# Patient Record
Sex: Male | Born: 1980 | Race: White | Hispanic: No | Smoking: Former smoker
Health system: Southern US, Community
[De-identification: ages and names within clinical notes are randomized; demographics above are authoritative.]

## PROBLEM LIST (undated history)

## (undated) DIAGNOSIS — J45909 Unspecified asthma, uncomplicated: Secondary | ICD-10-CM

## (undated) DIAGNOSIS — W3400XA Accidental discharge from unspecified firearms or gun, initial encounter: Secondary | ICD-10-CM

## (undated) DIAGNOSIS — F419 Anxiety disorder, unspecified: Secondary | ICD-10-CM

## (undated) DIAGNOSIS — F329 Major depressive disorder, single episode, unspecified: Secondary | ICD-10-CM

## (undated) DIAGNOSIS — F32A Depression, unspecified: Secondary | ICD-10-CM

## (undated) DIAGNOSIS — G8929 Other chronic pain: Secondary | ICD-10-CM

## (undated) HISTORY — PX: PLEURAL SCARIFICATION: SHX748

## (undated) HISTORY — DX: Other chronic pain: G89.29

---
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
## 2000-02-16 ENCOUNTER — Emergency Department (HOSPITAL_COMMUNITY): Admission: EM | Admit: 2000-02-16 | Discharge: 2000-02-16 | Payer: Self-pay

## 2000-02-16 ENCOUNTER — Encounter: Payer: Self-pay | Admitting: Emergency Medicine

## 2000-06-02 ENCOUNTER — Encounter: Admission: RE | Admit: 2000-06-02 | Discharge: 2000-08-31 | Payer: Self-pay | Admitting: Orthopedic Surgery

## 2002-09-15 ENCOUNTER — Encounter: Payer: Self-pay | Admitting: Surgery

## 2002-09-15 ENCOUNTER — Inpatient Hospital Stay (HOSPITAL_COMMUNITY): Admission: AC | Admit: 2002-09-15 | Discharge: 2002-09-17 | Payer: Self-pay

## 2002-09-15 ENCOUNTER — Encounter: Payer: Self-pay | Admitting: General Surgery

## 2002-09-16 ENCOUNTER — Encounter: Payer: Self-pay | Admitting: Surgery

## 2003-12-21 ENCOUNTER — Emergency Department (HOSPITAL_COMMUNITY): Admission: EM | Admit: 2003-12-21 | Discharge: 2003-12-21 | Payer: Self-pay | Admitting: Emergency Medicine

## 2005-01-24 ENCOUNTER — Emergency Department (HOSPITAL_COMMUNITY): Admission: EM | Admit: 2005-01-24 | Discharge: 2005-01-24 | Payer: Self-pay | Admitting: Emergency Medicine

## 2005-03-23 ENCOUNTER — Emergency Department (HOSPITAL_COMMUNITY): Admission: AC | Admit: 2005-03-23 | Discharge: 2005-03-23 | Payer: Self-pay

## 2005-12-15 ENCOUNTER — Emergency Department (HOSPITAL_COMMUNITY): Admission: EM | Admit: 2005-12-15 | Discharge: 2005-12-15 | Payer: Self-pay | Admitting: Emergency Medicine

## 2006-10-07 ENCOUNTER — Encounter: Admission: RE | Admit: 2006-10-07 | Discharge: 2006-10-07 | Payer: Self-pay | Admitting: Internal Medicine

## 2007-01-12 ENCOUNTER — Emergency Department (HOSPITAL_COMMUNITY): Admission: EM | Admit: 2007-01-12 | Discharge: 2007-01-12 | Payer: Self-pay | Admitting: Emergency Medicine

## 2007-02-25 ENCOUNTER — Emergency Department (HOSPITAL_COMMUNITY): Admission: EM | Admit: 2007-02-25 | Discharge: 2007-02-25 | Payer: Self-pay | Admitting: Emergency Medicine

## 2007-05-31 ENCOUNTER — Emergency Department (HOSPITAL_COMMUNITY): Admission: EM | Admit: 2007-05-31 | Discharge: 2007-05-31 | Payer: Self-pay | Admitting: Emergency Medicine

## 2007-08-26 ENCOUNTER — Emergency Department (HOSPITAL_COMMUNITY): Admission: EM | Admit: 2007-08-26 | Discharge: 2007-08-26 | Payer: Self-pay | Admitting: Emergency Medicine

## 2007-10-12 ENCOUNTER — Encounter: Admission: RE | Admit: 2007-10-12 | Discharge: 2007-10-12 | Payer: Self-pay | Admitting: Family Medicine

## 2007-10-12 ENCOUNTER — Ambulatory Visit: Payer: Self-pay | Admitting: Family Medicine

## 2007-10-18 ENCOUNTER — Ambulatory Visit: Payer: Self-pay | Admitting: Family Medicine

## 2007-11-20 ENCOUNTER — Emergency Department (HOSPITAL_COMMUNITY): Admission: EM | Admit: 2007-11-20 | Discharge: 2007-11-20 | Payer: Self-pay | Admitting: Emergency Medicine

## 2008-02-14 ENCOUNTER — Emergency Department (HOSPITAL_COMMUNITY): Admission: EM | Admit: 2008-02-14 | Discharge: 2008-02-14 | Payer: Self-pay | Admitting: Family Medicine

## 2008-02-24 ENCOUNTER — Ambulatory Visit: Payer: Self-pay | Admitting: Family Medicine

## 2008-11-07 ENCOUNTER — Emergency Department (HOSPITAL_COMMUNITY): Admission: EM | Admit: 2008-11-07 | Discharge: 2008-11-07 | Payer: Self-pay | Admitting: Emergency Medicine

## 2008-11-08 ENCOUNTER — Ambulatory Visit (HOSPITAL_COMMUNITY): Admission: RE | Admit: 2008-11-08 | Discharge: 2008-11-08 | Payer: Self-pay | Admitting: Psychiatry

## 2008-11-21 ENCOUNTER — Emergency Department (HOSPITAL_COMMUNITY): Admission: EM | Admit: 2008-11-21 | Discharge: 2008-11-22 | Payer: Self-pay | Admitting: Emergency Medicine

## 2009-02-05 ENCOUNTER — Emergency Department (HOSPITAL_COMMUNITY): Admission: EM | Admit: 2009-02-05 | Discharge: 2009-02-05 | Payer: Self-pay | Admitting: Family Medicine

## 2009-03-13 ENCOUNTER — Emergency Department (HOSPITAL_COMMUNITY): Admission: EM | Admit: 2009-03-13 | Discharge: 2009-03-13 | Payer: Self-pay | Admitting: Emergency Medicine

## 2009-10-12 ENCOUNTER — Emergency Department (HOSPITAL_COMMUNITY): Admission: EM | Admit: 2009-10-12 | Discharge: 2009-10-12 | Payer: Self-pay | Admitting: Emergency Medicine

## 2009-10-15 ENCOUNTER — Ambulatory Visit: Payer: Self-pay | Admitting: Family Medicine

## 2009-11-01 ENCOUNTER — Encounter: Admission: RE | Admit: 2009-11-01 | Discharge: 2009-11-01 | Payer: Self-pay | Admitting: Family Medicine

## 2009-11-01 ENCOUNTER — Ambulatory Visit: Payer: Self-pay | Admitting: Family Medicine

## 2009-11-02 ENCOUNTER — Ambulatory Visit: Payer: Self-pay | Admitting: Family Medicine

## 2010-01-07 ENCOUNTER — Emergency Department (HOSPITAL_COMMUNITY)
Admission: EM | Admit: 2010-01-07 | Discharge: 2010-01-08 | Payer: Self-pay | Source: Home / Self Care | Admitting: Emergency Medicine

## 2010-01-10 ENCOUNTER — Ambulatory Visit
Admission: RE | Admit: 2010-01-10 | Discharge: 2010-01-10 | Payer: Self-pay | Source: Home / Self Care | Attending: Family Medicine | Admitting: Family Medicine

## 2010-03-18 LAB — COMPREHENSIVE METABOLIC PANEL
ALT: 15 U/L (ref 0–53)
Alkaline Phosphatase: 65 U/L (ref 39–117)
BUN: 18 mg/dL (ref 6–23)
Calcium: 9.3 mg/dL (ref 8.4–10.5)
Chloride: 102 mEq/L (ref 96–112)
Creatinine, Ser: 0.98 mg/dL (ref 0.4–1.5)
GFR calc Af Amer: >60 mL/min (ref >60)
GFR calc non Af Amer: >60 mL/min (ref >60)
Glucose, Bld: 93 mg/dL (ref 70–99)
Potassium: 3.6 mEq/L (ref 3.5–5.1)
Sodium: 137 mEq/L (ref 135–145)
Total Protein: 6.6 g/dL (ref 6.0–8.3)

## 2010-03-18 LAB — URINALYSIS, ROUTINE W REFLEX MICROSCOPIC
Bilirubin Urine: NEGATIVE
Protein, ur: 30 mg/dL — AB
Specific Gravity, Urine: 1.03 (ref 1.005–1.030)
Urobilinogen, UA: 0.2 mg/dL (ref 0.0–1.0)

## 2010-03-18 LAB — DIFFERENTIAL
Basophils Absolute: 0.1 10*3/uL (ref 0.0–0.1)
Eosinophils Absolute: 0.4 10*3/uL (ref 0.0–0.7)
Eosinophils Relative: 2 % (ref 0–5)
Lymphocytes Relative: 15 % (ref 12–46)
Lymphs Abs: 2.6 10*3/uL (ref 0.7–4.0)
Monocytes Absolute: 1.5 10*3/uL — ABNORMAL HIGH (ref 0.1–1.0)
Monocytes Relative: 9 % (ref 3–12)
Neutro Abs: 13.1 10*3/uL — ABNORMAL HIGH (ref 1.7–7.7)
Neutrophils Relative %: 74 % (ref 43–77)

## 2010-03-18 LAB — CBC
MCHC: 34.5 g/dL (ref 30.0–36.0)
RDW: 14.1 % (ref 11.5–15.5)

## 2010-03-18 LAB — POCT I-STAT, CHEM 8
Glucose, Bld: 89 mg/dL (ref 70–99)
Hemoglobin: 16.3 g/dL (ref 13.0–17.0)

## 2010-03-18 LAB — URINE MICROSCOPIC-ADD ON

## 2010-03-18 LAB — HEPATIC FUNCTION PANEL
Albumin: 4.2 g/dL (ref 3.5–5.2)
Alkaline Phosphatase: 65 U/L (ref 39–117)
Bilirubin, Direct: 0.2 mg/dL (ref 0.0–0.3)
Total Bilirubin: 0.8 mg/dL (ref 0.3–1.2)
Total Protein: 6.6 g/dL (ref 6.0–8.3)

## 2010-03-18 LAB — URINE CULTURE
Colony Count: NO GROWTH
Culture  Setup Time: 201201020045
Culture: NO GROWTH

## 2010-03-29 LAB — URINALYSIS, ROUTINE W REFLEX MICROSCOPIC
Glucose, UA: NEGATIVE mg/dL
Hgb urine dipstick: NEGATIVE
Ketones, ur: 15 mg/dL — AB
Nitrite: NEGATIVE
Protein, ur: NEGATIVE mg/dL
Specific Gravity, Urine: 1.028 (ref 1.005–1.030)
Urobilinogen, UA: 1 mg/dL (ref 0.0–1.0)

## 2010-04-10 LAB — COMPREHENSIVE METABOLIC PANEL
ALT: 16 U/L (ref 0–53)
Alkaline Phosphatase: 77 U/L (ref 39–117)
Chloride: 108 mEq/L (ref 96–112)
Glucose, Bld: 117 mg/dL — ABNORMAL HIGH (ref 70–99)
Potassium: 4 mEq/L (ref 3.5–5.1)
Sodium: 140 mEq/L (ref 135–145)
Total Protein: 7.8 g/dL (ref 6.0–8.3)

## 2010-04-10 LAB — URINALYSIS, ROUTINE W REFLEX MICROSCOPIC
Glucose, UA: NEGATIVE mg/dL
Nitrite: NEGATIVE
Protein, ur: NEGATIVE mg/dL
Specific Gravity, Urine: 1.028 (ref 1.005–1.030)
Urobilinogen, UA: 0.2 mg/dL (ref 0.0–1.0)

## 2010-04-10 LAB — DIFFERENTIAL
Basophils Relative: 0 % (ref 0–1)
Basophils Relative: 1 % (ref 0–1)
Eosinophils Absolute: 0.1 10*3/uL (ref 0.0–0.7)
Eosinophils Absolute: 0.2 10*3/uL (ref 0.0–0.7)
Monocytes Absolute: 0.7 10*3/uL (ref 0.1–1.0)
Monocytes Relative: 6 % (ref 3–12)
Neutrophils Relative %: 89 % — ABNORMAL HIGH (ref 43–77)
Neutrophils Relative %: 75 % (ref 43–77)

## 2010-04-10 LAB — CBC
HCT: 48.3 % (ref 39.0–52.0)
MCHC: 35.4 g/dL (ref 30.0–36.0)
MCHC: 35.1 g/dL (ref 30.0–36.0)
MCV: 87.3 fL (ref 78.0–100.0)
MCV: 87.4 fL (ref 78.0–100.0)
Platelets: 246 10*3/uL (ref 150–400)
RDW: 12.9 % (ref 11.5–15.5)

## 2010-04-10 LAB — URINE MICROSCOPIC-ADD ON

## 2010-04-10 LAB — BASIC METABOLIC PANEL
BUN: 10 mg/dL (ref 6–23)
CO2: 24 mEq/L (ref 19–32)
Chloride: 105 mEq/L (ref 96–112)
Creatinine, Ser: 0.72 mg/dL (ref 0.4–1.5)

## 2010-05-06 ENCOUNTER — Ambulatory Visit: Payer: Self-pay | Admitting: Medical

## 2010-07-28 ENCOUNTER — Inpatient Hospital Stay (INDEPENDENT_AMBULATORY_CARE_PROVIDER_SITE_OTHER)
Admission: RE | Admit: 2010-07-28 | Discharge: 2010-07-28 | Disposition: A | Discharge status: Home / Self Care | Payer: Self-pay | Source: Ambulatory Visit | Attending: Family Medicine | Admitting: Family Medicine

## 2010-07-28 DIAGNOSIS — S335XXA Sprain of ligaments of lumbar spine, initial encounter: Secondary | ICD-10-CM

## 2010-07-31 ENCOUNTER — Encounter: Payer: Self-pay | Admitting: Medical

## 2010-07-31 ENCOUNTER — Ambulatory Visit
Admission: RE | Admit: 2010-07-31 | Discharge: 2010-07-31 | Disposition: A | Discharge status: Home / Self Care | Payer: No Typology Code available for payment source | Source: Ambulatory Visit | Attending: Medical | Admitting: Medical

## 2010-07-31 ENCOUNTER — Ambulatory Visit (INDEPENDENT_AMBULATORY_CARE_PROVIDER_SITE_OTHER): Payer: No Typology Code available for payment source | Admitting: Medical

## 2010-07-31 VITALS — BP 120/70 | HR 86 | Wt 198.0 lb

## 2010-07-31 DIAGNOSIS — S335XXA Sprain of ligaments of lumbar spine, initial encounter: Secondary | ICD-10-CM

## 2010-07-31 DIAGNOSIS — R209 Unspecified disturbances of skin sensation: Secondary | ICD-10-CM

## 2010-07-31 DIAGNOSIS — S3992XA Unspecified injury of lower back, initial encounter: Secondary | ICD-10-CM

## 2010-07-31 DIAGNOSIS — M549 Dorsalgia, unspecified: Secondary | ICD-10-CM

## 2010-07-31 DIAGNOSIS — R202 Paresthesia of skin: Secondary | ICD-10-CM

## 2010-07-31 MED ORDER — HYDROCODONE-ACETAMINOPHEN 7.5-750 MG PO TABS: 1.0000 | ORAL_TABLET | Freq: Four times a day (QID) | ORAL | Status: DC | PRN

## 2010-07-31 MED ORDER — OXYCODONE HCL 5 MG PO TABS: 5.0000 mg | ORAL_TABLET | Freq: Three times a day (TID) | ORAL | Status: DC | PRN

## 2010-07-31 MED ORDER — CYCLOBENZAPRINE HCL 10 MG PO TABS: 10.0000 mg | ORAL_TABLET | Freq: Three times a day (TID) | ORAL | Status: DC | PRN

## 2010-07-31 NOTE — Progress Notes (Signed)
Subjective:    Thomas Hays is a 30 y.o. male who presents for evaluation of low back pain.  His mother is with him today.  He denies history of back problems.  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 He felt immediate pain in his low back, fell to the ground, and had to be helped back home. Since then he notes severe low back pain, and over the last few days the pain radiates to his right buttock and leg causing the right leg and toes the film him at times. He has tried over-the-counter remedies and ice with no improvement. Thus he decided to come in to get this checked out.  The following portions of the patient's history were reviewed and updated as appropriate: allergies, current medications, past family history, past medical history, past social history, past surgical history and problem list.  Review of Systems Constitutional: denies fever, chills, sweats, unexpected weight change, anorexia, fatigue Cardiology: denies chest pain, palpitations, edema Respiratory: denies cough, shortness of breath Gastroenterology: denies abdominal pain, nausea, vomiting, diarrhea, constipation, blood in stool, changes in bowel movement, dysphagia Hematology: denies bleeding or bruising problems Musculoskeletal: denies arthralgias, myalgias, joint swelling, neck pain, cramping Urology: denies dysuria, difficulty urinating, hematuria, urinary frequency, urgency, incontinence     Objective:   Filed Vitals:   07/31/10 1035  BP: 120/70  Pulse: 86    General appearance:  WD/WN, white male, crying in pain, sitting very stiff and guarded Skin: no erythema or ecchymosis, no obvious skin trauma Neck: supple, no lymphadenopathy, no thyromegaly, no masses, normal ROM Heart: RRR, normal S1, S2, no murmurs Lungs: CTA bilaterally, no wheezes, rhonchi, or rales Abdomen: +bs, soft, non  tender, non distended Back: quite tender throughout lumbar spine midline and on the right, pain with either SLR, right and left, ROM quite limited due to the pain Musculoskeletal: tender with right hip external ROM, but otherwise lower extremities non tender, no obvious deformity, normal ROM throughout Extremities: no edema Pulses: 2+ symmetric, lower extremities Neurological: LE normal strength, DTRs 3+ of patellas    Assessment:     Encounter Diagnoses  Name Primary?  . Back pain Yes  . Paresthesia of right leg   . Lower back injury      Plan:    Given his Tylenol intolerance and severe pain, gave script for Flexeril and Oxycodone today, and he will go for xray today.

## 2010-08-01 ENCOUNTER — Telehealth: Payer: Self-pay | Admitting: Medical

## 2010-08-01 MED ORDER — PREDNISONE 20 MG PO TABS: 20.0000 mg | ORAL_TABLET | Freq: Two times a day (BID) | ORAL | Status: AC

## 2010-08-01 NOTE — Telephone Encounter (Signed)
Call out (an order electronically) prednisone 20mg , 2 tablets daily x 5 days, #10 no refill.

## 2010-08-01 NOTE — Telephone Encounter (Signed)
pls call wife with results.  See other msg from today.

## 2010-08-01 NOTE — Telephone Encounter (Signed)
Sent prednisone 20 Mg 1 po BID #10 x 5 days with no refills to Pharmacy- [facility].  Pt aware.  CM, LPN

## 2010-08-01 NOTE — Telephone Encounter (Signed)
Called pt's wife and informed her of xray results.  Pt's wife wanted to know if you could give him something stronger for pain because pain medication is not working.  CM, LPN

## 2010-08-01 NOTE — Telephone Encounter (Signed)
See msg

## 2010-08-02 ENCOUNTER — Emergency Department (HOSPITAL_COMMUNITY)
Admission: EM | Admit: 2010-08-02 | Discharge: 2010-08-02 | Disposition: A | Discharge status: Home / Self Care | Payer: Self-pay | Attending: Emergency Medicine | Admitting: Emergency Medicine

## 2010-08-02 DIAGNOSIS — M545 Low back pain, unspecified: Secondary | ICD-10-CM | POA: Insufficient documentation

## 2010-08-02 DIAGNOSIS — IMO0002 Reserved for concepts with insufficient information to code with codable children: Secondary | ICD-10-CM | POA: Insufficient documentation

## 2010-08-08 ENCOUNTER — Inpatient Hospital Stay
Admit: 2010-08-08 | Discharge: 2010-08-08 | Disposition: A | Discharge status: Home / Self Care | Payer: Self-pay | Attending: Neurological Surgery | Admitting: Neurological Surgery

## 2010-08-08 ENCOUNTER — Ambulatory Visit
Admission: RE | Admit: 2010-08-08 | Discharge: 2010-08-08 | Disposition: A | Discharge status: Home / Self Care | Payer: Medicaid Other | Source: Ambulatory Visit

## 2010-08-08 DIAGNOSIS — M549 Dorsalgia, unspecified: Secondary | ICD-10-CM

## 2010-08-20 ENCOUNTER — Inpatient Hospital Stay (INDEPENDENT_AMBULATORY_CARE_PROVIDER_SITE_OTHER): Admit: 2010-08-20 | Discharge: 2010-08-20 | Disposition: A | Discharge status: Home / Self Care | Payer: Medicaid Other

## 2010-08-20 ENCOUNTER — Emergency Department (HOSPITAL_COMMUNITY)
Admission: EM | Admit: 2010-08-20 | Discharge: 2010-08-20 | Discharge status: Against Medical Advice | Payer: Medicaid Other | Attending: Emergency Medicine | Admitting: Emergency Medicine

## 2010-08-20 ENCOUNTER — Other Ambulatory Visit (HOSPITAL_COMMUNITY): Payer: Self-pay | Admitting: Neurological Surgery

## 2010-08-20 DIAGNOSIS — S335XXA Sprain of ligaments of lumbar spine, initial encounter: Secondary | ICD-10-CM

## 2010-08-20 DIAGNOSIS — M549 Dorsalgia, unspecified: Secondary | ICD-10-CM

## 2010-09-23 ENCOUNTER — Encounter: Payer: Self-pay | Admitting: Family Medicine

## 2010-09-25 LAB — CULTURE, BORDETELLA W/DFA-ST LAB: Culture: NOT DETECTED

## 2010-09-27 LAB — DIFFERENTIAL
Basophils Absolute: 0.1
Basophils Relative: 1
Eosinophils Absolute: 0.1
Eosinophils Relative: 1
Lymphocytes Relative: 11 — ABNORMAL LOW
Lymphs Abs: 1.1
Monocytes Absolute: 0.4
Monocytes Relative: 4
Neutro Abs: 8.1 — ABNORMAL HIGH
Neutrophils Relative %: 83 — ABNORMAL HIGH

## 2010-09-27 LAB — COMPREHENSIVE METABOLIC PANEL WITH GFR
Alkaline Phosphatase: 61
BUN: 13
Chloride: 108
Creatinine, Ser: 0.84
Glucose, Bld: 104 — ABNORMAL HIGH
Potassium: 3.9
Total Bilirubin: 0.8

## 2010-09-27 LAB — LIPASE, BLOOD: Lipase: 16

## 2010-09-27 LAB — CARBOXYHEMOGLOBIN
Carboxyhemoglobin: 20.2
Methemoglobin: 1.5
O2 Saturation: 56.2
Total hemoglobin: 14.4

## 2010-09-27 LAB — COMPREHENSIVE METABOLIC PANEL
ALT: 23
AST: 20
Albumin: 4.1
CO2: 28
Calcium: 9.1
GFR calc Af Amer: >60
GFR calc non Af Amer: >60
Sodium: 141
Total Protein: 6.2

## 2010-09-27 LAB — CBC
HCT: 40.2
Hemoglobin: 14.5
MCHC: 36.1 — ABNORMAL HIGH
MCV: 85.2
Platelets: 214
RBC: 4.72
RDW: 13.1
WBC: 9.8

## 2010-09-27 LAB — URINE MICROSCOPIC-ADD ON

## 2010-09-27 LAB — URINALYSIS, ROUTINE W REFLEX MICROSCOPIC
Bilirubin Urine: NEGATIVE
Glucose, UA: NEGATIVE
Hgb urine dipstick: NEGATIVE
Ketones, ur: NEGATIVE
Nitrite: NEGATIVE
Protein, ur: NEGATIVE
Specific Gravity, Urine: 1.025
Urobilinogen, UA: 0.2
pH: 7.5

## 2010-11-14 IMAGING — CR DG LUMBAR SPINE COMPLETE 4+V
5 series · 5 of 5 positions shown · non-contrast
Comparison: 11/22/2008 CT.

CLINICAL DATA: Back pain post injury for 2 weeks.

LUMBAR SPINE - COMPLETE 4+ VIEW

[view not recorded (1 of 5)]
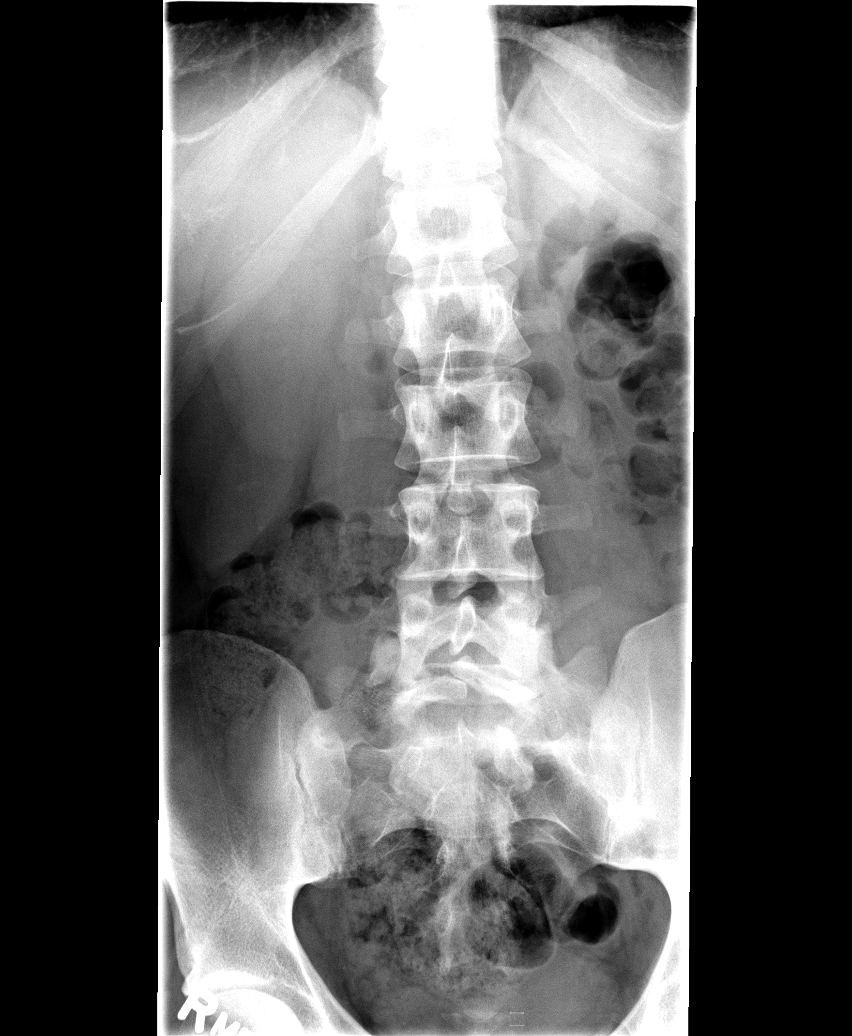

[view not recorded (2 of 5)]
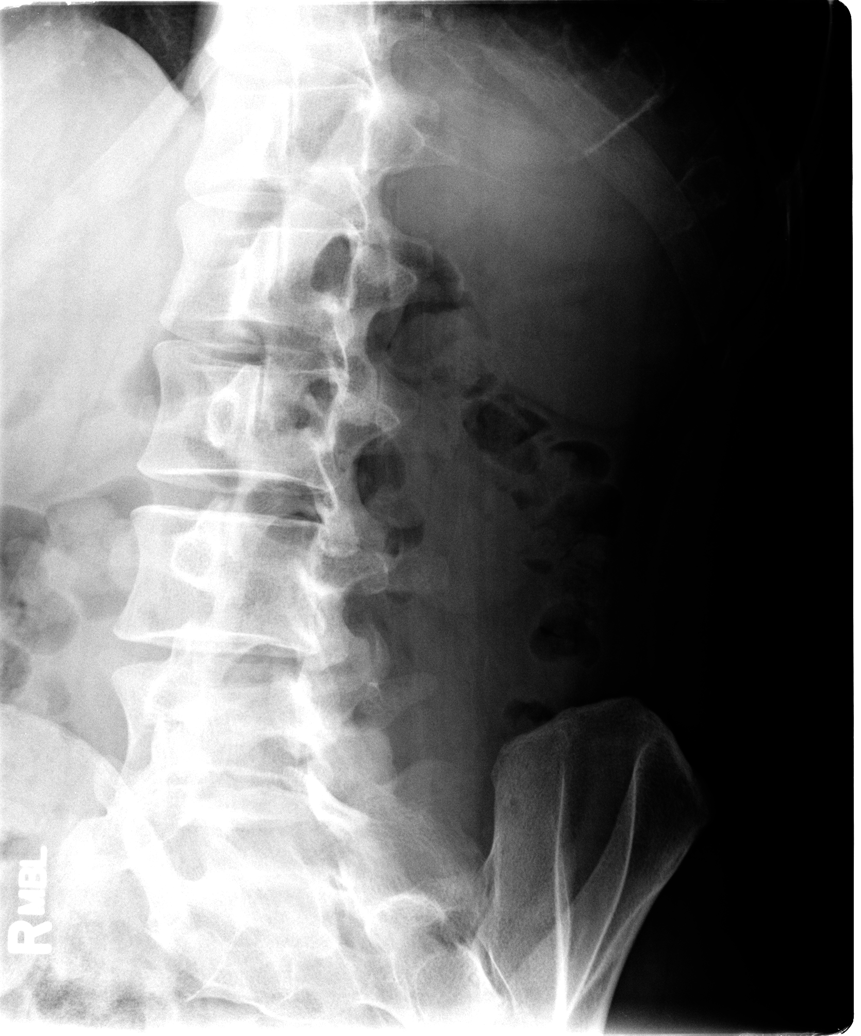

[view not recorded (3 of 5)]
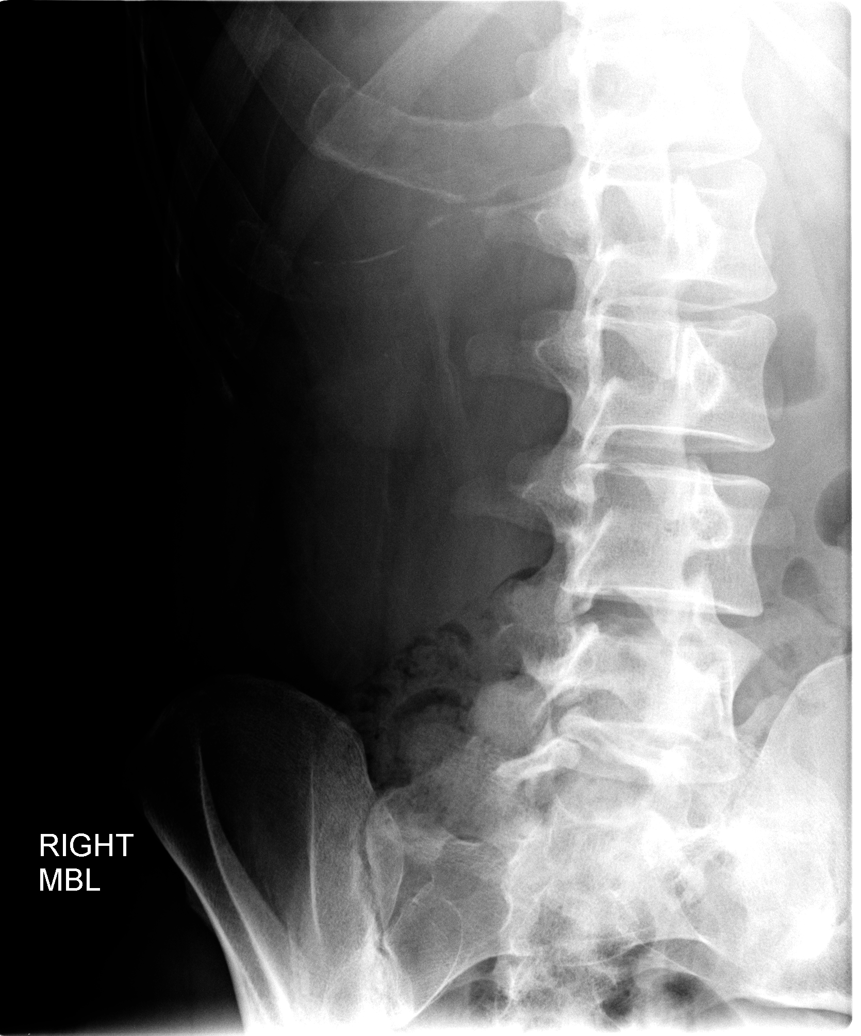

[view not recorded (4 of 5)]
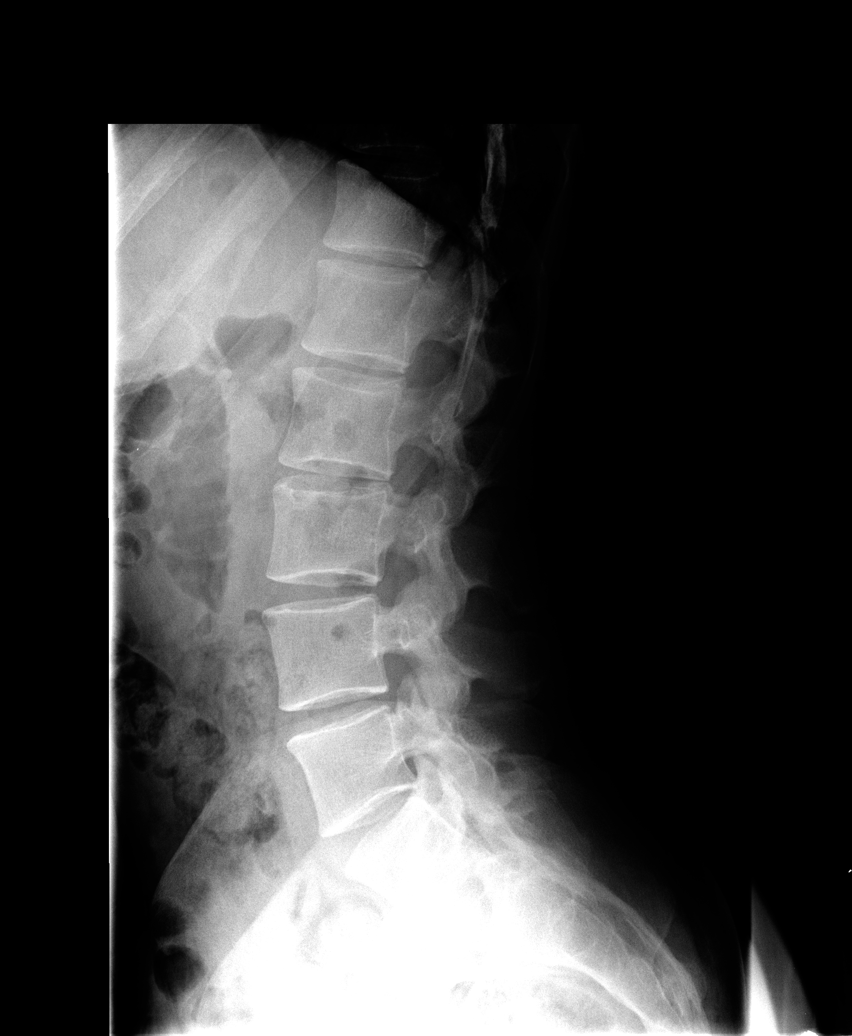

[view not recorded (5 of 5)]
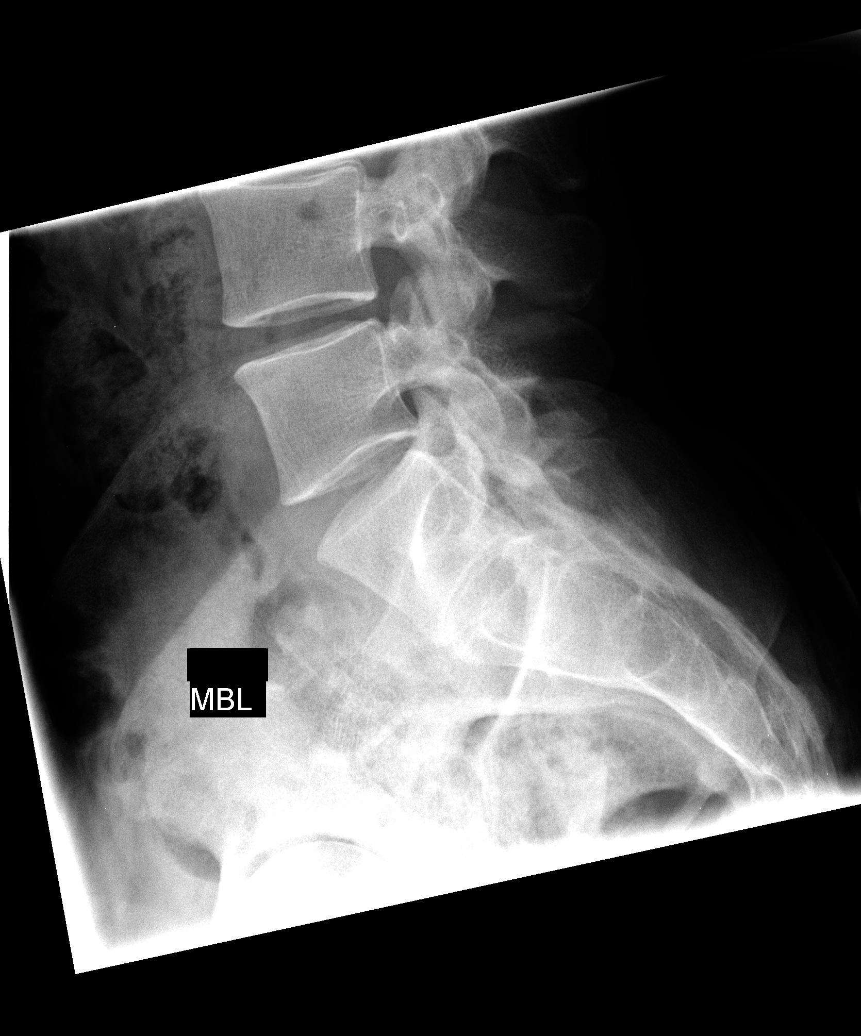

[5 of 5 positions shown; findings below may reference images not displayed]

FINDINGS: Transitional vertebra lumbosacral junction.  No fracture
or malalignment.  Mild disc space narrowing L4-5.
IMPRESSION: Mild disc space narrowing L4-5.

Transitional appearance S1 vertebra.

## 2010-11-24 ENCOUNTER — Inpatient Hospital Stay (HOSPITAL_COMMUNITY)
Admission: EM | Admit: 2010-11-24 | Discharge: 2010-11-27 | DRG: 392 | Disposition: A | Discharge status: Home / Self Care | Payer: Medicaid Other | Attending: Internal Medicine | Admitting: Internal Medicine

## 2010-11-24 ENCOUNTER — Emergency Department (HOSPITAL_COMMUNITY): Payer: Medicaid Other

## 2010-11-24 ENCOUNTER — Inpatient Hospital Stay (HOSPITAL_COMMUNITY): Payer: Medicaid Other

## 2010-11-24 ENCOUNTER — Encounter (HOSPITAL_COMMUNITY): Payer: Self-pay | Admitting: *Deleted

## 2010-11-24 DIAGNOSIS — J939 Pneumothorax, unspecified: Secondary | ICD-10-CM | POA: Insufficient documentation

## 2010-11-24 DIAGNOSIS — J209 Acute bronchitis, unspecified: Secondary | ICD-10-CM | POA: Diagnosis present

## 2010-11-24 DIAGNOSIS — N179 Acute kidney failure, unspecified: Secondary | ICD-10-CM | POA: Diagnosis present

## 2010-11-24 DIAGNOSIS — E860 Dehydration: Secondary | ICD-10-CM | POA: Diagnosis present

## 2010-11-24 DIAGNOSIS — R112 Nausea with vomiting, unspecified: Secondary | ICD-10-CM

## 2010-11-24 DIAGNOSIS — A088 Other specified intestinal infections: Principal | ICD-10-CM | POA: Diagnosis present

## 2010-11-24 DIAGNOSIS — K529 Noninfective gastroenteritis and colitis, unspecified: Secondary | ICD-10-CM | POA: Diagnosis present

## 2010-11-24 DIAGNOSIS — J40 Bronchitis, not specified as acute or chronic: Secondary | ICD-10-CM

## 2010-11-24 DIAGNOSIS — Z79899 Other long term (current) drug therapy: Secondary | ICD-10-CM

## 2010-11-24 DIAGNOSIS — D72829 Elevated white blood cell count, unspecified: Secondary | ICD-10-CM | POA: Diagnosis present

## 2010-11-24 HISTORY — DX: Accidental discharge from unspecified firearms or gun, initial encounter: W34.00XA

## 2010-11-24 LAB — URINALYSIS, ROUTINE W REFLEX MICROSCOPIC
Leukocytes, UA: NEGATIVE
Nitrite: NEGATIVE
Specific Gravity, Urine: 1.019 (ref 1.005–1.030)
Urobilinogen, UA: 0.2 mg/dL (ref 0.0–1.0)
pH: 5 (ref 5.0–8.0)

## 2010-11-24 LAB — DIFFERENTIAL
Basophils Absolute: 0 10*3/uL (ref 0.0–0.1)
Basophils Relative: 0 % (ref 0–1)
Eosinophils Absolute: 0 10*3/uL (ref 0.0–0.7)
Monocytes Absolute: 2.8 10*3/uL — ABNORMAL HIGH (ref 0.1–1.0)
Neutro Abs: 16.6 10*3/uL — ABNORMAL HIGH (ref 1.7–7.7)
Neutrophils Relative %: 81 % — ABNORMAL HIGH (ref 43–77)

## 2010-11-24 LAB — BASIC METABOLIC PANEL
BUN: 55 mg/dL — ABNORMAL HIGH (ref 6–23)
CO2: 23 mEq/L (ref 19–32)
CO2: 22 mEq/L (ref 19–32)
Calcium: 10.6 mg/dL — ABNORMAL HIGH (ref 8.4–10.5)
Calcium: 8.6 mg/dL (ref 8.4–10.5)
Chloride: 89 mEq/L — ABNORMAL LOW (ref 96–112)
Creatinine, Ser: 2.47 mg/dL — ABNORMAL HIGH (ref 0.50–1.35)
Creatinine, Ser: 1.31 mg/dL (ref 0.50–1.35)
Glucose, Bld: 130 mg/dL — ABNORMAL HIGH (ref 70–99)
Glucose, Bld: 113 mg/dL — ABNORMAL HIGH (ref 70–99)
Sodium: 138 mEq/L (ref 135–145)

## 2010-11-24 LAB — CBC
HCT: 53.1 % — ABNORMAL HIGH (ref 39.0–52.0)
HCT: 40.8 % (ref 39.0–52.0)
Hemoglobin: 14.7 g/dL (ref 13.0–17.0)
MCH: 30.3 pg (ref 26.0–34.0)
MCH: 29.6 pg (ref 26.0–34.0)
MCHC: 36 g/dL (ref 30.0–36.0)
MCV: 81.3 fL (ref 78.0–100.0)
MCV: 82.3 fL (ref 78.0–100.0)
Platelets: 377 10*3/uL (ref 150–400)
RBC: 4.96 MIL/uL (ref 4.22–5.81)
RDW: 13.2 % (ref 11.5–15.5)

## 2010-11-24 LAB — URINE MICROSCOPIC-ADD ON

## 2010-11-24 MED ORDER — AZITHROMYCIN 250 MG PO TABS
500.0000 mg | ORAL_TABLET | Freq: Every day | ORAL | Status: AC
  Administered 2010-11-24: 500 mg via ORAL
  Filled 2010-11-24: qty 2

## 2010-11-24 MED ORDER — NICOTINE 14 MG/24HR TD PT24
14.0000 mg | MEDICATED_PATCH | Freq: Every day | TRANSDERMAL | Status: DC
  Administered 2010-11-24 – 2010-11-27 (×4): 14 mg via TRANSDERMAL
  Filled 2010-11-24 (×5): qty 1

## 2010-11-24 MED ORDER — SODIUM CHLORIDE 0.9 % IV SOLN
INTRAVENOUS | Status: DC
  Administered 2010-11-24 – 2010-11-26 (×4): via INTRAVENOUS
  Administered 2010-11-26: 125 mL/h via INTRAVENOUS

## 2010-11-24 MED ORDER — HYDROMORPHONE HCL PF 1 MG/ML IJ SOLN
1.0000 mg | Freq: Once | INTRAMUSCULAR | Status: AC
  Administered 2010-11-24: 1 mg via INTRAVENOUS

## 2010-11-24 MED ORDER — SODIUM CHLORIDE 0.9 % IV BOLUS (SEPSIS)
1000.0000 mL | Freq: Once | INTRAVENOUS | Status: AC
  Administered 2010-11-24: 1000 mL via INTRAVENOUS

## 2010-11-24 MED ORDER — AZITHROMYCIN 250 MG PO TABS
250.0000 mg | ORAL_TABLET | Freq: Every day | ORAL | Status: DC
Start: 2010-11-25 — End: 2010-11-27
  Administered 2010-11-25 – 2010-11-27 (×3): 250 mg via ORAL
  Filled 2010-11-24 (×3): qty 1

## 2010-11-24 MED ORDER — HYDROMORPHONE HCL PF 2 MG/ML IJ SOLN
INTRAMUSCULAR | Status: AC
  Administered 2010-11-24: 16:00:00
  Filled 2010-11-24: qty 1

## 2010-11-24 MED ORDER — ALUM & MAG HYDROXIDE-SIMETH 200-200-20 MG/5ML PO SUSP: 30.0000 mL | Freq: Four times a day (QID) | ORAL | Status: DC | PRN

## 2010-11-24 MED ORDER — ONDANSETRON HCL 4 MG/2ML IJ SOLN
4.0000 mg | Freq: Four times a day (QID) | INTRAMUSCULAR | Status: DC | PRN
  Administered 2010-11-25: 4 mg via INTRAVENOUS
  Filled 2010-11-24: qty 2

## 2010-11-24 MED ORDER — PANTOPRAZOLE SODIUM 40 MG PO TBEC
40.0000 mg | DELAYED_RELEASE_TABLET | Freq: Every day | ORAL | Status: DC
  Administered 2010-11-26 – 2010-11-27 (×2): 40 mg via ORAL
  Filled 2010-11-24 (×3): qty 1

## 2010-11-24 MED ORDER — HYDROMORPHONE HCL PF 1 MG/ML IJ SOLN
1.0000 mg | INTRAMUSCULAR | Status: DC | PRN
  Administered 2010-11-24 – 2010-11-27 (×14): 1 mg via INTRAVENOUS
  Filled 2010-11-24 (×14): qty 1

## 2010-11-24 MED ORDER — ONDANSETRON HCL 4 MG PO TABS: 4.0000 mg | ORAL_TABLET | Freq: Four times a day (QID) | ORAL | Status: DC | PRN

## 2010-11-24 MED ORDER — HYDROMORPHONE HCL PF 1 MG/ML IJ SOLN
1.0000 mg | Freq: Once | INTRAMUSCULAR | Status: AC
  Administered 2010-11-24: 1 mg via INTRAVENOUS
  Filled 2010-11-24: qty 1

## 2010-11-24 MED ORDER — HYDROMORPHONE HCL PF 1 MG/ML IJ SOLN: 0.5000 mg | INTRAMUSCULAR | Status: DC | PRN

## 2010-11-24 MED ORDER — HEPARIN SODIUM (PORCINE) 5000 UNIT/ML IJ SOLN
5000.0000 [IU] | Freq: Three times a day (TID) | INTRAMUSCULAR | Status: DC
  Administered 2010-11-25 (×3): 5000 [IU] via SUBCUTANEOUS
  Filled 2010-11-24 (×13): qty 1

## 2010-11-24 MED ORDER — ONDANSETRON HCL 4 MG/2ML IJ SOLN
4.0000 mg | Freq: Once | INTRAMUSCULAR | Status: AC
  Administered 2010-11-24: 4 mg via INTRAVENOUS
  Filled 2010-11-24: qty 2
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
## 2010-11-24 NOTE — ED Notes (Signed)
Pt here complaining of nausea and he believes he has gotten food poisoning from eating nachos.  Pt is having abdominal cramping that he states is a 9/10 at this time.  Pt is very thin and his eyes appear more withdrawn and sunken back in his head.  Pt family states he has lost 15 pounds since Friday. Pt mucous membranes are dry and he appears to be dehydrated.

## 2010-11-24 NOTE — ED Notes (Signed)
Pt has not been able to hold anything down since Friday, face is sunken in and this is new per family
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
## 2010-11-25 LAB — PHOSPHORUS: Phosphorus: 2.3 mg/dL (ref 2.3–4.6)

## 2010-11-25 LAB — CREATININE, URINE, RANDOM: Creatinine, Urine: 131.42 mg/dL

## 2010-11-25 LAB — DIFFERENTIAL
Basophils Relative: 0 % (ref 0–1)
Eosinophils Absolute: 0 10*3/uL (ref 0.0–0.7)
Lymphs Abs: 1.6 10*3/uL (ref 0.7–4.0)
Neutrophils Relative %: 71 % (ref 43–77)

## 2010-11-25 LAB — CBC
MCH: 29.2 pg (ref 26.0–34.0)
MCHC: 34.7 g/dL (ref 30.0–36.0)
Platelets: 227 10*3/uL (ref 150–400)
RBC: 4.45 MIL/uL (ref 4.22–5.81)

## 2010-11-25 LAB — COMPREHENSIVE METABOLIC PANEL
Alkaline Phosphatase: 53 U/L (ref 39–117)
BUN: 30 mg/dL — ABNORMAL HIGH (ref 6–23)
Creatinine, Ser: 0.68 mg/dL (ref 0.50–1.35)
GFR calc Af Amer: >90 mL/min (ref >90)
Glucose, Bld: 104 mg/dL — ABNORMAL HIGH (ref 70–99)
Potassium: 4.1 mEq/L (ref 3.5–5.1)
Total Protein: 5.9 g/dL — ABNORMAL LOW (ref 6.0–8.3)

## 2010-11-25 LAB — PROTIME-INR
INR: 1.18 (ref 0.00–1.49)
Prothrombin Time: 15.3 seconds — ABNORMAL HIGH (ref 11.6–15.2)

## 2010-11-25 LAB — SODIUM, URINE, RANDOM: Sodium, Ur: 64 mEq/L

## 2010-11-25 LAB — MAGNESIUM: Magnesium: 2.3 mg/dL (ref 1.5–2.5)

## 2010-11-25 NOTE — Progress Notes (Signed)
Subjective: Feeling much better Objective: Vital signs in last 24 hours: Filed Vitals:   11/24/10 2058 11/25/10 0516 11/25/10 0818 11/25/10 1300  BP: 124/79 108/62  116/62  Pulse: 95 73  112  Temp: 100.2 F (37.9 C) 98.9 F (37.2 C)  98.4 F (36.9 C)  TempSrc: Oral Oral  Axillary  Resp: 18 16  18   Height:      Weight:   59.33 kg (130 lb 12.8 oz)   SpO2: 96% 98%  95%   Weight change:   Intake/Output Summary (Last 24 hours) at 11/25/10 1957 Last data filed at 11/25/10 1700  Gross per 24 hour  Intake   1915 ml  Output   1215 ml  Net    700 ml   Physical Exam:   General Appearance:    Alert, cooperative, no distress, appears stated age  Lungs:     Clear to auscultation bilaterally, respirations unlabored   Heart:    Regular rate and rhythm, S1 and S2 normal, no murmur, rub   or gallop  Abdomen:     Soft, non-tender, bowel sounds active all four quadrants,    no masses, no organomegaly  Extremities:   Extremities normal, atraumatic, no cyanosis or edema  Pulses:   2+ and symmetric all extremities  Skin:   Skin color, texture, turgor normal, no rashes or lesions  Neurologic:   CNII-XII intact, normal strength, sensation and reflexes    throughout     Lab Results: Results for orders placed during the hospital encounter of 11/24/10 (from the past 24 hour(s))  SODIUM, URINE, RANDOM     Status: Normal   Collection Time   11/25/10  2:51 AM      Component Value Range   Sodium, Ur 64    CREATININE, URINE, RANDOM     Status: Normal   Collection Time   11/25/10  2:51 AM      Component Value Range   Creatinine, Urine 131.42    COMPREHENSIVE METABOLIC PANEL     Status: Abnormal   Collection Time   11/25/10  7:00 AM      Component Value Range   Sodium 140  135 - 145 (mEq/L)   Potassium 4.1  3.5 - 5.1 (mEq/L)   Chloride 107  96 - 112 (mEq/L)   CO2 24  19 - 32 (mEq/L)   Glucose, Bld 104 (*) 70 - 99 (mg/dL)   BUN 30 (*) 6 - 23 (mg/dL)   Creatinine, Ser 7.82  0.50 - 1.35  (mg/dL)   Calcium 8.4  8.4 - 95.6 (mg/dL)   Total Protein 5.9 (*) 6.0 - 8.3 (g/dL)   Albumin 3.5  3.5 - 5.2 (g/dL)   AST 19  0 - 37 (U/L)   ALT 9  0 - 53 (U/L)   Alkaline Phosphatase 53  39 - 117 (U/L)   Total Bilirubin 1.0  0.3 - 1.2 (mg/dL)   GFR calc non Af Amer >90  >90 (mL/min)   GFR calc Af Amer >90  >90 (mL/min)  MAGNESIUM     Status: Normal   Collection Time   11/25/10  7:00 AM      Component Value Range   Magnesium 2.3  1.5 - 2.5 (mg/dL)  PHOSPHORUS     Status: Normal   Collection Time   11/25/10  7:00 AM      Component Value Range   Phosphorus 2.3  2.3 - 4.6 (mg/dL)  CBC     Status: Abnormal  Collection Time   11/25/10  7:00 AM      Component Value Range   WBC 9.8  4.0 - 10.5 (K/uL)   RBC 4.45  4.22 - 5.81 (MIL/uL)   Hemoglobin 13.0  13.0 - 17.0 (g/dL)   HCT 16.1 (*) 09.6 - 52.0 (%)   MCV 84.3  78.0 - 100.0 (fL)   MCH 29.2  26.0 - 34.0 (pg)   MCHC 34.7  30.0 - 36.0 (g/dL)   RDW 04.5  40.9 - 81.1 (%)   Platelets 227  150 - 400 (K/uL)  DIFFERENTIAL     Status: Abnormal   Collection Time   11/25/10  7:00 AM      Component Value Range   Neutrophils Relative 71  43 - 77 (%)   Neutro Abs 6.9  1.7 - 7.7 (K/uL)   Lymphocytes Relative 16  12 - 46 (%)   Lymphs Abs 1.6  0.7 - 4.0 (K/uL)   Monocytes Relative 13 (*) 3 - 12 (%)   Monocytes Absolute 1.3 (*) 0.1 - 1.0 (K/uL)   Eosinophils Relative 0  0 - 5 (%)   Eosinophils Absolute 0.0  0.0 - 0.7 (K/uL)   Basophils Relative 0  0 - 1 (%)   Basophils Absolute 0.0  0.0 - 0.1 (K/uL)  PROTIME-INR     Status: Abnormal   Collection Time   11/25/10  7:00 AM      Component Value Range   Prothrombin Time 15.3 (*) 11.6 - 15.2 (seconds)   INR 1.18  0.00 - 1.49   TSH     Status: Normal   Collection Time   11/25/10  7:00 AM      Component Value Range   TSH 0.775  0.350 - 4.500 (uIU/mL)  CLOSTRIDIUM DIFFICILE BY PCR     Status: Normal   Collection Time   11/25/10  8:21 AM      Component Value Range   C difficile by pcr  NEGATIVE  NEGATIVE    Micro Results: Recent Results (from the past 240 hour(s))  CLOSTRIDIUM DIFFICILE BY PCR     Status: Normal   Collection Time   11/25/10  8:21 AM      Component Value Range Status Comment   C difficile by pcr NEGATIVE  NEGATIVE  Final    Studies/Results: Ct Abdomen Pelvis Wo Contrast  11/24/2010  *RADIOLOGY REPORT*  Clinical Data: Nausea, vomiting, diarrhea.  CT ABDOMEN AND PELVIS WITHOUT CONTRAST  Technique:  Multidetector CT imaging of the abdomen and pelvis was performed following the standard protocol without intravenous contrast.  Comparison: 01/08/2010  Findings:  Limited images through the lung bases demonstrate no significant appreciable abnormality. The heart size is within normal limits. No pleural or pericardial effusion.  Intra-abdominal organ evaluation is limited without intravenous contrast.  Within this limitation, unremarkable liver, spleen, pancreas, adrenal glands.  The gallbladder is not well delineated. No biliary ductal dilatation.  Symmetric renal size. 5 mm hyperdense focus within the interpolar/lower pole left kidney is nonspecific.  May reflect a tiny hyperdense cyst.  No hydronephrosis or hydroureter.  No urinary tract calculi identified. 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  Two ureters, likely fused proximal to the UVJ.  No bowel obstruction.  Normal appendix.  No free intraperitoneal air or fluid.  Normal caliber vasculature. No lymphadenopathy.  Thin-walled bladder.  Several sclerotic foci, including within the right femoral neck, right femoral head, left iliac bone, likely reflect bone as.  There is partial ankylosis of  the bilateral SI joints, left greater than right.  IMPRESSION:  Within limitations of a noncontrast exam, no acute abnormality identified.  No hydronephrosis or hydroureter.  No urinary tract calculi.  Original Report Authenticated By: Waneta Martins, M.D.   Ct Head Wo Contrast  11/24/2010  *RADIOLOGY REPORT*  Clinical  Data: Fall.  Headache.  Nausea and vomiting.  CT HEAD WITHOUT CONTRAST 11/24/2010:  Technique:  Contiguous axial images were obtained from the base of the skull through the vertex without contrast.  Comparison: Unenhanced cranial CT 03/23/2005 and 01/24/2005 [facility].  Findings: Ventricular system normal in size and appearance for age. No mass lesion.  No midline shift.  No acute hemorrhage or hematoma.  No extra-axial fluid collections.  No evidence of acute infarction.  No focal brain parenchymal abnormalities.  No significant interval change.  No skull fractures or other focal osseous abnormalities involving the skull.  Visualized paranasal sinuses, mastoid air cells, and middle ear cavities well-aerated.  IMPRESSION: Normal and stable examination.  Original Report Authenticated By: Arnell Sieving, M.D.   Dg Abd Acute W/chest  11/24/2010  *RADIOLOGY REPORT*  Clinical Data: Abdominal pain.  Altered level of consciousness. Weakness.  ACUTE ABDOMEN SERIES (ABDOMEN 2 VIEW & CHEST 1 VIEW)  Comparison: 07/31/2010  Findings: Wedge resection clips noted at the left lung apex.  Attenuated peripheral pulmonary vasculature is compatible with emphysema/COPD.  Cardiac and mediastinal contours appear unremarkable.  No pleural effusion noted.  The bowel is nearly gasless. I suspect a dilated loop of small bowel in the lower pelvis, measuring 3.8 cm in diameter.  IMPRESSION:  1.  Suspected dilated loop of small bowel in the pelvis.  The appearance is considered abnormal but nonspecific and could be from ileus or obstruction.  Most of the bowel is gasless. 2.  Emphysema. 3.  The wedge resection clips noted at the left lung apex.  Original Report Authenticated By: Dellia Cloud, M.D.   Medications: reviewed Scheduled Meds:   . azithromycin  250 mg Oral Daily  . heparin  5,000 Units Subcutaneous Q8H  . nicotine  14 mg Transdermal Daily  . pantoprazole  40 mg Oral Q0600   Continuous Infusions:    . sodium chloride 125 mL/hr at 11/25/10 1632   PRN Meds:.alum & mag hydroxide-simeth, HYDROmorphone, ondansetron (ZOFRAN) IV, ondansetron Assessment/Plan: Principal Problem:  *Gastroenteritis, acute resolved Active Problems:  Bronchitis improving  Acute renal failure resolved  Dehydration resolved.   LOS: 1 day   Legend Tumminello 11/25/2010, 7:57 PM

## 2010-11-26 LAB — BASIC METABOLIC PANEL
BUN: 19 mg/dL (ref 6–23)
CO2: 24 mEq/L (ref 19–32)
Chloride: 107 mEq/L (ref 96–112)
Creatinine, Ser: 0.6 mg/dL (ref 0.50–1.35)

## 2010-11-26 LAB — CBC
HCT: 34.3 % — ABNORMAL LOW (ref 39.0–52.0)
Hemoglobin: 11.8 g/dL — ABNORMAL LOW (ref 13.0–17.0)
MCV: 85.1 fL (ref 78.0–100.0)
RBC: 4.03 MIL/uL — ABNORMAL LOW (ref 4.22–5.81)
WBC: 4.9 10*3/uL (ref 4.0–10.5)

## 2010-11-26 LAB — URINE CULTURE: Special Requests: NORMAL

## 2010-11-26 NOTE — Progress Notes (Signed)
11/26/2010 [name] Case Management Note [phone]       Utilization review completed.  

## 2010-11-26 NOTE — Progress Notes (Signed)
Subjective:  Two episodes of vomiting overnight.  Objective: Vital signs in last 24 hours: Filed Vitals:   11/25/10 1300 11/25/10 2230 11/26/10 0640 11/26/10 1349  BP: 116/62 121/71 108/59 113/76  Pulse: 112 60 60 57  Temp: 98.4 F (36.9 C) 99.4 F (37.4 C) 98.1 F (36.7 C) 98 F (36.7 C)  TempSrc: Axillary Oral Oral Oral  Resp: 18 20 20 18   Height:      Weight:      SpO2: 95% 98% 98% 97%   Weight change: 6.431 kg (14 lb 2.8 oz)  Intake/Output Summary (Last 24 hours) at 11/26/10 1959 Last data filed at 11/26/10 1700  Gross per 24 hour  Intake    620 ml  Output      0 ml  Net    620 ml   Physical Exam:   General Appearance:    Alert, cooperative, no distress, appears stated age  Lungs:     Clear to auscultation bilaterally, respirations unlabored   Heart:    Regular rate and rhythm, S1 and S2 normal, no murmur, rub   or gallop  Abdomen:     Soft, non-tender, bowel sounds active all four quadrants,    no masses, no organomegaly  Extremities:   Extremities normal, atraumatic, no cyanosis or edema  Pulses:   2+ and symmetric all extremities  Skin:   Skin color, texture, turgor normal, no rashes or lesions  Neurologic:   CNII-XII intact, normal strength, sensation and reflexes    throughout     Lab Results: Results for orders placed during the hospital encounter of 11/24/10 (from the past 24 hour(s))  CBC     Status: Abnormal   Collection Time   11/26/10  5:00 AM      Component Value Range   WBC 4.9  4.0 - 10.5 (K/uL)   RBC 4.03 (*) 4.22 - 5.81 (MIL/uL)   Hemoglobin 11.8 (*) 13.0 - 17.0 (g/dL)   HCT 16.1 (*) 09.6 - 52.0 (%)   MCV 85.1  78.0 - 100.0 (fL)   MCH 29.3  26.0 - 34.0 (pg)   MCHC 34.4  30.0 - 36.0 (g/dL)   RDW 04.5  40.9 - 81.1 (%)   Platelets 171  150 - 400 (K/uL)  BASIC METABOLIC PANEL     Status: Abnormal   Collection Time   11/26/10  5:00 AM      Component Value Range   Sodium 138  135 - 145 (mEq/L)   Potassium 3.9  3.5 - 5.1 (mEq/L)   Chloride 107  96 - 112 (mEq/L)   CO2 24  19 - 32 (mEq/L)   Glucose, Bld 97  70 - 99 (mg/dL)   BUN 19  6 - 23 (mg/dL)   Creatinine, Ser 9.14  0.50 - 1.35 (mg/dL)   Calcium 8.3 (*) 8.4 - 10.5 (mg/dL)   GFR calc non Af Amer >90  >90 (mL/min)   GFR calc Af Amer >90  >90 (mL/min)    Micro Results: Recent Results (from the past 240 hour(s))  URINE CULTURE     Status: Normal   Collection Time   11/25/10  2:51 AM      Component Value Range Status Comment   Specimen Description URINE, CLEAN CATCH   Final    Special Requests Normal   Final    Setup Time [id number]   Final    Colony Count 2,000 COLONIES/ML   Final    Culture INSIGNIFICANT GROWTH   Final  Report Status 11/26/2010 FINAL   Final   STOOL CULTURE     Status: Normal (Preliminary result)   Collection Time   11/25/10  8:21 AM      Component Value Range Status Comment   Specimen Description STOOL   Final    Special Requests Normal   Final    Culture Culture reincubated for better growth   Final    Report Status PENDING   Incomplete   CLOSTRIDIUM DIFFICILE BY PCR     Status: Normal   Collection Time   11/25/10  8:21 AM      Component Value Range Status Comment   C difficile by pcr NEGATIVE  NEGATIVE  Final    Studies/Results: Ct Abdomen Pelvis Wo Contrast  11/24/2010  *RADIOLOGY REPORT*  Clinical Data: Nausea, vomiting, diarrhea.  CT ABDOMEN AND PELVIS WITHOUT CONTRAST  Technique:  Multidetector CT imaging of the abdomen and pelvis was performed following the standard protocol without intravenous contrast.  Comparison: 01/08/2010  Findings:  Limited images through the lung bases demonstrate no significant appreciable abnormality. The heart size is within normal limits. No pleural or pericardial effusion.  Intra-abdominal organ evaluation is limited without intravenous contrast.  Within this limitation, unremarkable liver, spleen, pancreas, adrenal glands.  The gallbladder is not well delineated. No biliary ductal dilatation.   Symmetric renal size. 5 mm hyperdense focus within the interpolar/lower pole left kidney is nonspecific.  May reflect a tiny hyperdense cyst.  No hydronephrosis or hydroureter.  No urinary tract calculi identified. 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  Two ureters, likely fused proximal to the UVJ.  No bowel obstruction.  Normal appendix.  No free intraperitoneal air or fluid.  Normal caliber vasculature. No lymphadenopathy.  Thin-walled bladder.  Several sclerotic foci, including within the right femoral neck, right femoral head, left iliac bone, likely reflect bone as.  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  IMPRESSION:  Within limitations of a noncontrast exam, no acute abnormality identified.  No hydronephrosis or hydroureter.  No urinary tract calculi.  Original Report Authenticated By: Waneta Martins, M.D.   Ct Head Wo Contrast  11/24/2010  *RADIOLOGY REPORT*  Clinical Data: Fall.  Headache.  Nausea and vomiting.  CT HEAD WITHOUT CONTRAST 11/24/2010:  Technique:  Contiguous axial images were obtained from the base of the skull through the vertex without contrast.  Comparison: Unenhanced cranial CT 03/23/2005 and 01/24/2005 [facility].  Findings: Ventricular system normal in size and appearance for age. No mass lesion.  No midline shift.  No acute hemorrhage or hematoma.  No extra-axial fluid collections.  No evidence of acute infarction.  No focal brain parenchymal abnormalities.  No significant interval change.  No skull fractures or other focal osseous abnormalities involving the skull.  Visualized paranasal sinuses, mastoid air cells, and middle ear cavities well-aerated.  IMPRESSION: Normal and stable examination.  Original Report Authenticated By: Arnell Sieving, M.D.   Medications: reviewed. Scheduled Meds:   . azithromycin  250 mg Oral Daily  . heparin  5,000 Units Subcutaneous Q8H  . nicotine  14 mg Transdermal Daily  .  pantoprazole  40 mg Oral Q0600   Continuous Infusions:   . DISCONTD: sodium chloride 125 mL/hr at 11/26/10 0822   PRN Meds:.alum & mag hydroxide-simeth, HYDROmorphone, ondansetron (ZOFRAN) IV, ondansetron Assessment/Plan: Principal Problem:  *Gastroenteritis, acute: resolving. On full liquid diet. Advance diet as tolerated.  Possible /dc in am if pt is tolerating regular diet.  Bronchitis on z pack  Acute renal failure resolved.  Dehydration resolved.   LOS: 2 days   Thomas Hays 11/26/2010, 7:59 PM

## 2010-11-27 ENCOUNTER — Encounter: Payer: Self-pay | Admitting: Medical

## 2010-11-27 ENCOUNTER — Ambulatory Visit (INDEPENDENT_AMBULATORY_CARE_PROVIDER_SITE_OTHER): Payer: Medicaid Other | Admitting: Medical

## 2010-11-27 VITALS — BP 120/70 | HR 68 | Temp 98.2°F | Wt 136.0 lb

## 2010-11-27 DIAGNOSIS — R109 Unspecified abdominal pain: Secondary | ICD-10-CM

## 2010-11-27 DIAGNOSIS — R5383 Other fatigue: Secondary | ICD-10-CM

## 2010-11-27 DIAGNOSIS — K5289 Other specified noninfective gastroenteritis and colitis: Secondary | ICD-10-CM

## 2010-11-27 DIAGNOSIS — K529 Noninfective gastroenteritis and colitis, unspecified: Secondary | ICD-10-CM | POA: Insufficient documentation

## 2010-11-27 DIAGNOSIS — E860 Dehydration: Secondary | ICD-10-CM | POA: Insufficient documentation

## 2010-11-27 DIAGNOSIS — K3189 Other diseases of stomach and duodenum: Secondary | ICD-10-CM

## 2010-11-27 LAB — POCT URINALYSIS DIPSTICK
Ketones, UA: NEGATIVE
Leukocytes, UA: NEGATIVE
Nitrite, UA: NEGATIVE
Protein, UA: NEGATIVE

## 2010-11-27 MED ORDER — AZITHROMYCIN 250 MG PO TABS: ORAL_TABLET | ORAL | Status: AC

## 2010-11-27 MED ORDER — ALUM & MAG HYDROXIDE-SIMETH 200-200-20 MG/5ML PO SUSP: 15.0000 mL | Freq: Four times a day (QID) | ORAL | Status: AC | PRN
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
## 2010-11-27 NOTE — Patient Instructions (Signed)
Viral Gastroenteritis Viral gastroenteritis is also known as stomach flu. This condition affects the stomach and intestinal tract. The illness typically lasts 3 to 8 days. Most people develop an immune response. This eventually gets rid of the virus. While this natural response develops, the virus can make you quite ill.  CAUSES  Diarrhea and vomiting are often caused by a virus. Medicines (antibiotics) that kill germs will not help unless there is also a germ (bacterial) infection. SYMPTOMS  The most common symptom is diarrhea. This can cause severe loss of fluids (dehydration) and body salt (electrolyte) imbalance. TREATMENT  Treatments for this illness are aimed at rehydration. Antidiarrheal medicines are not recommended. They do not decrease diarrhea volume and may be harmful. Usually, home treatment is all that is needed. The most serious cases involve vomiting so severely that you are not able to keep down fluids taken by mouth (orally). In these cases, intravenous (IV) fluids are needed. Vomiting with viral gastroenteritis is common, but it will usually go away with treatment. HOME CARE INSTRUCTIONS  Small amounts of fluids should be taken frequently. Large amounts at one time may not be tolerated. Plain water may be harmful in infants and the elderly. Oral rehydration solutions (ORS) are available at pharmacies and grocery stores. ORS replace water and important electrolytes in proper proportions. Sports drinks are not as effective as ORS and may be harmful due to sugars worsening diarrhea.  As a general guideline for children, replace any new fluid losses from diarrhea or vomiting with ORS as follows:   If your child weighs 22 pounds or under (10 kg or less), give 60-120 mL (1/4 - 1/2 cup or 2 - 4 ounces) of ORS for each diarrheal stool or vomiting episode.   If your child weighs more than 22 pounds (more than 10 kgs), give 120-240 mL (1/2 - 1 cup or 4 - 8 ounces) of ORS for each diarrheal  stool or vomiting episode.   In a child with vomiting, it may be helpful to give the above ORS replacement in 5 mL (1 teaspoon) amounts every 5 minutes, then increase as tolerated.   While correcting for dehydration, children should eat normally. However, foods high in sugar should be avoided because this may worsen diarrhea. Large amounts of carbonated soft drinks, juice, gelatin desserts, and other highly sugared drinks should be avoided.   After correction of dehydration, other liquids that are appealing to the child may be added. Children should drink small amounts of fluids frequently and fluids should be increased as tolerated.   Adults should eat normally while drinking more fluids than usual. Drink small amounts of fluids frequently and increase as tolerated. Drink enough water and fluids to keep your urine clear or pale yellow. Broths, weak decaffeinated tea, lemon-lime soft drinks (allowed to go flat), and ORS replace fluids and electrolytes.   Avoid:   Carbonated drinks.   Juice.   Extremely hot or cold fluids.   Caffeine drinks.   Fatty, greasy foods.   Alcohol.   Tobacco.   Too much intake of anything at one time.   Gelatin desserts.   Probiotics are active cultures of beneficial bacteria. They may lessen the amount and number of diarrheal stools in adults. Probiotics can be found in yogurt with active cultures and in supplements.   Wash your hands well to avoid spreading bacteria and viruses.   Antidiarrheal medicines are not recommended for infants and children.   Only take over-the-counter or prescription medicines for   pain, discomfort, or fever as directed by your caregiver. Do not give aspirin to children.   For adults with dehydration, ask your caregiver if you should continue all prescribed and over-the-counter medicines.   If your caregiver has given you a follow-up appointment, it is very important to keep that appointment. Not keeping the appointment  could result in a lasting (chronic) or permanent injury and disability. If there is any problem keeping the appointment, you must call to reschedule.  SEEK IMMEDIATE MEDICAL CARE IF:   You are unable to keep fluids down.   There is no urine output in 6 to 8 hours or there is only a small amount of very dark urine.   You develop shortness of breath.   There is blood in the vomit (may look like coffee grounds) or stool.   Belly (abdominal) pain develops, increases, or localizes.   There is persistent vomiting or diarrhea.   You have a fever.   Your baby is older than 3 months with a rectal temperature of 102 F (38.9 C) or higher.   Your baby is 3 months old or younger with a rectal temperature of 100.4 F (38 C) or higher.  MAKE SURE YOU:   Understand these instructions.   Will watch your condition.   Will get help right away if you are not doing well or get worse.  Document Released: 12/23/2004 Document Revised: 09/04/2010 Document Reviewed: 05/06/2006 [employer] Patient Information 2012 [employer]. 

## 2010-12-02 ENCOUNTER — Encounter: Payer: Self-pay | Admitting: Medical

## 2010-12-02 ENCOUNTER — Ambulatory Visit (INDEPENDENT_AMBULATORY_CARE_PROVIDER_SITE_OTHER): Payer: Medicaid Other | Admitting: Medical

## 2010-12-02 VITALS — BP 110/80 | HR 96 | Temp 97.9°F | Resp 16 | Wt 138.0 lb

## 2010-12-02 DIAGNOSIS — M549 Dorsalgia, unspecified: Secondary | ICD-10-CM

## 2010-12-02 DIAGNOSIS — E860 Dehydration: Secondary | ICD-10-CM

## 2010-12-02 DIAGNOSIS — R5383 Other fatigue: Secondary | ICD-10-CM | POA: Insufficient documentation

## 2010-12-02 DIAGNOSIS — R5381 Other malaise: Secondary | ICD-10-CM

## 2010-12-02 DIAGNOSIS — R112 Nausea with vomiting, unspecified: Secondary | ICD-10-CM

## 2010-12-02 LAB — CBC
HCT: 41.7 % (ref 39.0–52.0)
Hemoglobin: 14.5 g/dL (ref 13.0–17.0)
MCHC: 34.8 g/dL (ref 30.0–36.0)
RBC: 4.87 MIL/uL (ref 4.22–5.81)
WBC: 9.7 10*3/uL (ref 4.0–10.5)

## 2010-12-02 LAB — POCT URINALYSIS DIPSTICK
Bilirubin, UA: NEGATIVE
Glucose, UA: NEGATIVE
Ketones, UA: NEGATIVE
Leukocytes, UA: NEGATIVE
Spec Grav, UA: 1.02

## 2010-12-02 LAB — BASIC METABOLIC PANEL
CO2: 23 mEq/L (ref 19–32)
Calcium: 9.8 mg/dL (ref 8.4–10.5)
Creat: 0.7 mg/dL (ref 0.50–1.35)
Sodium: 139 mEq/L (ref 135–145)

## 2010-12-02 MED ORDER — ONDANSETRON HCL 4 MG PO TABS: 4.0000 mg | ORAL_TABLET | Freq: Every day | ORAL | Status: DC | PRN

## 2010-12-02 MED ORDER — CYCLOBENZAPRINE HCL 10 MG PO TABS: 10.0000 mg | ORAL_TABLET | Freq: Three times a day (TID) | ORAL | Status: DC | PRN
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
## 2010-12-05 ENCOUNTER — Telehealth: Payer: Self-pay | Admitting: Medical

## 2010-12-05 NOTE — Telephone Encounter (Signed)
COPIED

## 2010-12-07 NOTE — ED Provider Notes (Signed)
Medical screening examination/treatment/procedure(s) were performed by non-physician practitioner and as supervising physician I was immediately available for consultation/collaboration.  Hurman Horn, MD 12/07/10 2217

## 2011-09-11 ENCOUNTER — Emergency Department (HOSPITAL_COMMUNITY)
Admission: EM | Admit: 2011-09-11 | Discharge: 2011-09-11 | Disposition: A | Discharge status: Home / Self Care | Payer: Medicaid Other | Attending: Emergency Medicine | Admitting: Emergency Medicine

## 2011-09-11 ENCOUNTER — Encounter (HOSPITAL_COMMUNITY): Payer: Self-pay | Admitting: Emergency Medicine

## 2011-09-11 ENCOUNTER — Emergency Department (HOSPITAL_COMMUNITY): Payer: Medicaid Other

## 2011-09-11 DIAGNOSIS — F17200 Nicotine dependence, unspecified, uncomplicated: Secondary | ICD-10-CM | POA: Insufficient documentation

## 2011-09-11 DIAGNOSIS — J45901 Unspecified asthma with (acute) exacerbation: Secondary | ICD-10-CM | POA: Insufficient documentation

## 2011-09-11 HISTORY — DX: Unspecified asthma, uncomplicated: J45.909

## 2011-09-11 MED ORDER — ALBUTEROL SULFATE (5 MG/ML) 0.5% IN NEBU
5.0000 mg | INHALATION_SOLUTION | Freq: Once | RESPIRATORY_TRACT | Status: AC
  Administered 2011-09-11: 5 mg via RESPIRATORY_TRACT
  Filled 2011-09-11: qty 1

## 2011-09-11 MED ORDER — ALBUTEROL (5 MG/ML) CONTINUOUS INHALATION SOLN
10.0000 mg/h | INHALATION_SOLUTION | RESPIRATORY_TRACT | Status: AC
  Administered 2011-09-11: 10 mg/h via RESPIRATORY_TRACT
  Filled 2011-09-11: qty 20

## 2011-09-11 MED ORDER — HYDROCODONE-ACETAMINOPHEN 5-325 MG PO TABS
1.0000 | ORAL_TABLET | Freq: Once | ORAL | Status: AC
  Administered 2011-09-11: 1 via ORAL
  Filled 2011-09-11: qty 1

## 2011-09-11 MED ORDER — PREDNISONE 20 MG PO TABS
60.0000 mg | ORAL_TABLET | Freq: Once | ORAL | Status: AC
  Administered 2011-09-11: 60 mg via ORAL
  Filled 2011-09-11: qty 3

## 2011-09-11 MED ORDER — HYDROCODONE-HOMATROPINE 5-1.5 MG/5ML PO SYRP: 5.0000 mL | ORAL_SOLUTION | Freq: Four times a day (QID) | ORAL | Status: AC | PRN

## 2011-09-11 MED ORDER — ALBUTEROL SULFATE HFA 108 (90 BASE) MCG/ACT IN AERS: 2.0000 | INHALATION_SPRAY | RESPIRATORY_TRACT | Status: DC | PRN

## 2011-09-11 MED ORDER — KETOROLAC TROMETHAMINE 60 MG/2ML IM SOLN
60.0000 mg | Freq: Once | INTRAMUSCULAR | Status: AC
  Administered 2011-09-11: 60 mg via INTRAMUSCULAR
  Filled 2011-09-11: qty 2

## 2011-09-11 MED ORDER — IPRATROPIUM BROMIDE 0.02 % IN SOLN
0.5000 mg | Freq: Once | RESPIRATORY_TRACT | Status: AC
  Administered 2011-09-11: 0.5 mg via RESPIRATORY_TRACT
  Filled 2011-09-11: qty 2.5

## 2011-09-11 NOTE — ED Notes (Signed)
Pt discharged with spouse, pt states has taken pain medications before with no reactions. Pt ambulatory at discharge, pt rates pain 6/10 currently. Pt alert and oriented x 4.
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
## 2011-09-11 NOTE — Discharge Instructions (Signed)

## 2011-09-11 NOTE — ED Notes (Signed)
Pt. Reports cough x1 week. States productive with green/brown sputum. Pt also c/o chest pain in central chest x2 days. No n/v, slight SOB. Hx of asthma, smoking, and pneumothorax

## 2011-09-11 NOTE — ED Notes (Signed)
Pt. States "I feel like I can breath better"

## 2011-09-11 NOTE — ED Notes (Signed)
PT. REPORTS PRODUCTIVE COUGH WITH SOB , RUNNY NOSE , CHILLS AND VOMITTING THIS EVENING , PT. STATES CHEST PAIN WHEN COUGHING PERSISTENTLY.

## 2011-09-18 ENCOUNTER — Inpatient Hospital Stay: Payer: Self-pay | Admitting: Internal Medicine

## 2011-09-18 LAB — COMPREHENSIVE METABOLIC PANEL
BUN: 21 mg/dL — ABNORMAL HIGH (ref 7–18)
Calcium, Total: 10 mg/dL (ref 8.5–10.1)
Chloride: 106 mmol/L (ref 98–107)
Co2: 22 mmol/L (ref 21–32)
Creatinine: 0.89 mg/dL (ref 0.60–1.30)
Potassium: 3.9 mmol/L (ref 3.5–5.1)
SGOT(AST): 17 U/L (ref 15–37)
Total Protein: 8.7 g/dL — ABNORMAL HIGH (ref 6.4–8.2)

## 2011-09-18 LAB — URINALYSIS, COMPLETE
Bacteria: NONE SEEN
Bilirubin,UR: NEGATIVE
Ketone: NEGATIVE
Ph: 5 (ref 4.5–8.0)
RBC,UR: 3 /HPF (ref 0–5)
WBC UR: NONE SEEN /HPF (ref 0–5)

## 2011-09-18 LAB — CBC
HGB: 17.2 g/dL (ref 13.0–18.0)
MCH: 29.2 pg (ref 26.0–34.0)
MCHC: 33.7 g/dL (ref 32.0–36.0)
Platelet: 414 10*3/uL (ref 150–440)
RDW: 14.4 % (ref 11.5–14.5)

## 2011-09-18 LAB — RAPID INFLUENZA A&B ANTIGENS

## 2011-09-18 LAB — DRUG SCREEN, URINE
Barbiturates, Ur Screen: NEGATIVE (ref ?–200)
Cocaine Metabolite,Ur ~~LOC~~: NEGATIVE (ref ?–300)
Methadone, Ur Screen: NEGATIVE (ref ?–300)
Opiate, Ur Screen: POSITIVE (ref ?–300)
Phencyclidine (PCP) Ur S: NEGATIVE (ref ?–25)

## 2011-09-18 LAB — MAGNESIUM: Magnesium: 2.2 mg/dL

## 2011-09-19 LAB — CBC WITH DIFFERENTIAL/PLATELET
Eosinophil %: 1 %
HCT: 37.9 % — ABNORMAL LOW (ref 40.0–52.0)
Lymphocyte #: 1.7 10*3/uL (ref 1.0–3.6)
Lymphocyte %: 17.1 %
MCH: 30.5 pg (ref 26.0–34.0)
MCV: 87 fL (ref 80–100)
Monocyte #: 0.9 x10 3/mm (ref 0.2–1.0)
Monocyte %: 8.9 %
Neutrophil #: 7.4 10*3/uL — ABNORMAL HIGH (ref 1.4–6.5)
RBC: 4.35 10*6/uL — ABNORMAL LOW (ref 4.40–5.90)
RDW: 14.2 % (ref 11.5–14.5)
WBC: 10.2 10*3/uL (ref 3.8–10.6)

## 2011-09-20 NOTE — ED Provider Notes (Signed)
History/physical exam/procedure(s) were performed by non-physician practitioner and as supervising physician I was immediately available for consultation/collaboration. I have reviewed all notes and am in agreement with care and plan.   Hilario Quarry, MD 09/20/11 [phone]

## 2011-09-21 LAB — BASIC METABOLIC PANEL WITH GFR
Anion Gap: 9
BUN: 8 mg/dL
Calcium, Total: 8 mg/dL — ABNORMAL LOW
Chloride: 111 mmol/L — ABNORMAL HIGH
Co2: 24 mmol/L
Creatinine: 0.82 mg/dL
EGFR (African American): >60
EGFR (Non-African Amer.): >60
Glucose: 85 mg/dL
Osmolality: 284
Potassium: 3.3 mmol/L — ABNORMAL LOW
Sodium: 144 mmol/L

## 2011-09-21 LAB — CBC WITH DIFFERENTIAL/PLATELET
Basophil #: 0.1 x10 3/mm 3
Basophil %: 1.1 %
Eosinophil #: 0.1 x10 3/mm 3
Eosinophil %: 1.9 %
HCT: 36.8 % — ABNORMAL LOW
HGB: 12.9 g/dL — ABNORMAL LOW
Lymphocyte %: 21.2 %
Lymphs Abs: 1.5 x10 3/mm 3
MCH: 30.4 pg
MCHC: 35 g/dL
MCV: 87 fL
Monocyte #: 0.6 "x10 3/mm "
Monocyte %: 8.9 %
Neutrophil #: 4.8 x10 3/mm 3
Neutrophil %: 66.9 %
Platelet: 211 x10 3/mm 3
RBC: 4.23 x10 6/mm 3 — ABNORMAL LOW
RDW: 13.3 %
WBC: 7.2 x10 3/mm 3

## 2011-09-23 LAB — CULTURE, BLOOD (SINGLE)

## 2011-10-21 ENCOUNTER — Emergency Department (HOSPITAL_COMMUNITY)
Admission: EM | Admit: 2011-10-21 | Discharge: 2011-10-21 | Disposition: A | Discharge status: Home / Self Care | Payer: Medicaid Other | Attending: Emergency Medicine | Admitting: Emergency Medicine

## 2011-10-21 ENCOUNTER — Encounter (HOSPITAL_COMMUNITY): Payer: Self-pay | Admitting: Emergency Medicine

## 2011-10-21 ENCOUNTER — Emergency Department (HOSPITAL_COMMUNITY): Payer: Medicaid Other

## 2011-10-21 DIAGNOSIS — R110 Nausea: Secondary | ICD-10-CM | POA: Insufficient documentation

## 2011-10-21 DIAGNOSIS — R197 Diarrhea, unspecified: Secondary | ICD-10-CM | POA: Insufficient documentation

## 2011-10-21 DIAGNOSIS — J45909 Unspecified asthma, uncomplicated: Secondary | ICD-10-CM | POA: Insufficient documentation

## 2011-10-21 DIAGNOSIS — R109 Unspecified abdominal pain: Secondary | ICD-10-CM | POA: Insufficient documentation

## 2011-10-21 LAB — COMPREHENSIVE METABOLIC PANEL
ALT: 13 U/L (ref 0–53)
BUN: 15 mg/dL (ref 6–23)
Calcium: 9.2 mg/dL (ref 8.4–10.5)
GFR calc Af Amer: >90 mL/min (ref >90)
Glucose, Bld: 106 mg/dL — ABNORMAL HIGH (ref 70–99)
Sodium: 135 mEq/L (ref 135–145)
Total Protein: 6.4 g/dL (ref 6.0–8.3)

## 2011-10-21 LAB — CBC WITH DIFFERENTIAL/PLATELET
Eosinophils Absolute: 0.4 10*3/uL (ref 0.0–0.7)
Eosinophils Relative: 3 % (ref 0–5)
HCT: 40.5 % (ref 39.0–52.0)
Hemoglobin: 14.4 g/dL (ref 13.0–17.0)
Lymphs Abs: 2.6 10*3/uL (ref 0.7–4.0)
MCHC: 35.6 g/dL (ref 30.0–36.0)
Neutrophils Relative %: 66 % (ref 43–77)
RDW: 13.3 % (ref 11.5–15.5)
WBC: 11.8 10*3/uL — ABNORMAL HIGH (ref 4.0–10.5)

## 2011-10-21 LAB — URINALYSIS, ROUTINE W REFLEX MICROSCOPIC
Glucose, UA: NEGATIVE mg/dL
Ketones, ur: NEGATIVE mg/dL
Leukocytes, UA: NEGATIVE
Nitrite: NEGATIVE
Specific Gravity, Urine: 1.027 (ref 1.005–1.030)
pH: 5.5 (ref 5.0–8.0)

## 2011-10-21 LAB — LIPASE, BLOOD: Lipase: 11 U/L (ref 11–59)

## 2011-10-21 LAB — OCCULT BLOOD, POC DEVICE: Fecal Occult Bld: NEGATIVE

## 2011-10-21 MED ORDER — IOHEXOL 300 MG/ML  SOLN
100.0000 mL | Freq: Once | INTRAMUSCULAR | Status: AC | PRN
  Administered 2011-10-21: 100 mL via INTRAVENOUS

## 2011-10-21 MED ORDER — HYDROCODONE-ACETAMINOPHEN 5-325 MG PO TABS: 1.0000 | ORAL_TABLET | ORAL | Status: DC | PRN

## 2011-10-21 MED ORDER — ONDANSETRON HCL 4 MG/2ML IJ SOLN
4.0000 mg | Freq: Once | INTRAMUSCULAR | Status: AC
  Administered 2011-10-21: 4 mg via INTRAVENOUS
  Filled 2011-10-21: qty 2

## 2011-10-21 MED ORDER — ONDANSETRON HCL 8 MG PO TABS: 8.0000 mg | ORAL_TABLET | Freq: Three times a day (TID) | ORAL | Status: DC | PRN

## 2011-10-21 MED ORDER — MORPHINE SULFATE 4 MG/ML IJ SOLN
4.0000 mg | Freq: Once | INTRAMUSCULAR | Status: AC
  Administered 2011-10-21: 4 mg via INTRAVENOUS
  Filled 2011-10-21: qty 1

## 2011-10-21 NOTE — ED Notes (Signed)
Patient transported to CT 

## 2011-10-21 NOTE — Discharge Instructions (Signed)
Take vicodin as prescribed for severe pain.  Do not drive within four hours of taking these medications (may cause drowsiness or confusion).  Take zofran as needed for nausea.   Drink plenty of fluids to prevent dehydration from diarrhea.  Follow up with the gastroenterologist as soon as possible.  You should return to the ER if you develop fever, worsening pain or uncontrolled vomiting.
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
## 2011-10-21 NOTE — ED Notes (Signed)
Pt states he has been having abd pain for over a month now  Pt states he has had diarrhea off and on for the past month  Pt states he has noticed blood in his stools for a few months  Pt states he has lost about 40 lbs over the past couple of years without trying  Pt states he came in tonight because of the pain

## 2011-10-21 NOTE — ED Provider Notes (Signed)
Medical screening examination/treatment/procedure(s) were performed by non-physician practitioner and as supervising physician I was immediately available for consultation/collaboration.  Daryus Sowash M Onur Mori, MD 10/21/11 0727 

## 2011-10-30 ENCOUNTER — Encounter (HOSPITAL_COMMUNITY): Payer: Self-pay | Admitting: Unknown Physician Specialty

## 2011-10-30 ENCOUNTER — Emergency Department (HOSPITAL_COMMUNITY)
Admission: EM | Admit: 2011-10-30 | Discharge: 2011-10-30 | Disposition: A | Discharge status: Home / Self Care | Payer: Medicaid Other | Attending: Emergency Medicine | Admitting: Emergency Medicine

## 2011-10-30 ENCOUNTER — Emergency Department (HOSPITAL_COMMUNITY): Payer: Medicaid Other

## 2011-10-30 DIAGNOSIS — F17200 Nicotine dependence, unspecified, uncomplicated: Secondary | ICD-10-CM | POA: Insufficient documentation

## 2011-10-30 DIAGNOSIS — R197 Diarrhea, unspecified: Secondary | ICD-10-CM | POA: Insufficient documentation

## 2011-10-30 DIAGNOSIS — Z8709 Personal history of other diseases of the respiratory system: Secondary | ICD-10-CM | POA: Insufficient documentation

## 2011-10-30 DIAGNOSIS — J45909 Unspecified asthma, uncomplicated: Secondary | ICD-10-CM | POA: Insufficient documentation

## 2011-10-30 DIAGNOSIS — R109 Unspecified abdominal pain: Secondary | ICD-10-CM | POA: Insufficient documentation

## 2011-10-30 DIAGNOSIS — Z79899 Other long term (current) drug therapy: Secondary | ICD-10-CM | POA: Insufficient documentation

## 2011-10-30 DIAGNOSIS — K589 Irritable bowel syndrome without diarrhea: Secondary | ICD-10-CM | POA: Insufficient documentation

## 2011-10-30 LAB — OCCULT BLOOD, POC DEVICE: Fecal Occult Bld: NEGATIVE

## 2011-10-30 LAB — CBC
HCT: 42.5 % (ref 39.0–52.0)
Platelets: 209 10*3/uL (ref 150–400)
RBC: 5.01 MIL/uL (ref 4.22–5.81)
RDW: 13 % (ref 11.5–15.5)
WBC: 5 10*3/uL (ref 4.0–10.5)

## 2011-10-30 LAB — COMPREHENSIVE METABOLIC PANEL
AST: 15 U/L (ref 0–37)
Albumin: 4 g/dL (ref 3.5–5.2)
Alkaline Phosphatase: 68 U/L (ref 39–117)
BUN: 15 mg/dL (ref 6–23)
CO2: 27 mEq/L (ref 19–32)
Chloride: 101 mEq/L (ref 96–112)
Potassium: 4.6 mEq/L (ref 3.5–5.1)
Total Bilirubin: 0.4 mg/dL (ref 0.3–1.2)

## 2011-10-30 MED ORDER — SODIUM CHLORIDE 0.9 % IV BOLUS (SEPSIS)
1000.0000 mL | Freq: Once | INTRAVENOUS | Status: AC
  Administered 2011-10-30: 1000 mL via INTRAVENOUS

## 2011-10-30 MED ORDER — MORPHINE SULFATE 2 MG/ML IJ SOLN: 1.0000 mg | Freq: Once | INTRAMUSCULAR | Status: DC

## 2011-10-30 MED ORDER — LOPERAMIDE HCL 2 MG PO TABS: 2.0000 mg | ORAL_TABLET | Freq: Four times a day (QID) | ORAL | Status: DC | PRN

## 2011-10-30 MED ORDER — IBUPROFEN 600 MG PO TABS: 600.0000 mg | ORAL_TABLET | Freq: Four times a day (QID) | ORAL | Status: DC | PRN

## 2011-10-30 MED ORDER — ONDANSETRON HCL 4 MG PO TABS: 4.0000 mg | ORAL_TABLET | Freq: Three times a day (TID) | ORAL | Status: DC | PRN

## 2011-10-30 MED ORDER — MORPHINE SULFATE 4 MG/ML IJ SOLN
4.0000 mg | Freq: Once | INTRAMUSCULAR | Status: AC
  Administered 2011-10-30: 4 mg via INTRAVENOUS
  Filled 2011-10-30: qty 1

## 2011-10-30 MED ORDER — ONDANSETRON HCL 4 MG/2ML IJ SOLN
4.0000 mg | Freq: Once | INTRAMUSCULAR | Status: AC
  Administered 2011-10-30: 4 mg via INTRAVENOUS
  Filled 2011-10-30 (×2): qty 2

## 2011-10-30 NOTE — ED Provider Notes (Signed)
I saw and evaluated the patient, reviewed the resident's note and I agree with the findings and plan.  Pt with several months of cramping abdominal pain and diarrhea. Has been in the ED for same with neg workup including CT in the last 2 weeks. Pt has GI appointment next week. Labs unchanged today.   Teneka Malmberg B. Bernette Mayers, MD 10/30/11 1041
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
## 2011-10-30 NOTE — ED Notes (Signed)
Patient states he has had abdominal pain x3 months. He was seen at [facility] 2 months ago and was diagnosed with colitis but the pain never went away. Last night and this morning are the worst. States pain is 10/10.

## 2011-11-04 ENCOUNTER — Encounter: Payer: Self-pay | Admitting: Medical

## 2011-12-01 DIAGNOSIS — J45909 Unspecified asthma, uncomplicated: Secondary | ICD-10-CM | POA: Insufficient documentation

## 2011-12-01 DIAGNOSIS — F17200 Nicotine dependence, unspecified, uncomplicated: Secondary | ICD-10-CM | POA: Insufficient documentation

## 2011-12-01 DIAGNOSIS — F1120 Opioid dependence, uncomplicated: Secondary | ICD-10-CM | POA: Insufficient documentation

## 2011-12-01 DIAGNOSIS — Z8709 Personal history of other diseases of the respiratory system: Secondary | ICD-10-CM | POA: Insufficient documentation

## 2011-12-01 NOTE — ED Notes (Signed)
Pt states he was told by [facility] to come here for detox for opiates.  Taking 30-60mg  oxycodone once or twice a day.

## 2011-12-02 ENCOUNTER — Inpatient Hospital Stay (HOSPITAL_COMMUNITY)
Admission: EM | Admit: 2011-12-02 | Discharge: 2011-12-08 | DRG: 897 | Disposition: A | Discharge status: Home / Self Care | Payer: Medicaid Other | Source: Ambulatory Visit | Attending: Psychiatry | Admitting: Psychiatry

## 2011-12-02 ENCOUNTER — Encounter (HOSPITAL_COMMUNITY): Payer: Self-pay | Admitting: *Deleted

## 2011-12-02 ENCOUNTER — Emergency Department (HOSPITAL_COMMUNITY)
Admission: EM | Admit: 2011-12-02 | Discharge: 2011-12-02 | Disposition: A | Discharge status: Other Acute Inpatient Hospital | Payer: Medicaid Other | Attending: Emergency Medicine | Admitting: Emergency Medicine

## 2011-12-02 DIAGNOSIS — F39 Unspecified mood [affective] disorder: Secondary | ICD-10-CM

## 2011-12-02 DIAGNOSIS — F1123 Opioid dependence with withdrawal: Secondary | ICD-10-CM

## 2011-12-02 DIAGNOSIS — J45909 Unspecified asthma, uncomplicated: Secondary | ICD-10-CM | POA: Diagnosis present

## 2011-12-02 DIAGNOSIS — F1994 Other psychoactive substance use, unspecified with psychoactive substance-induced mood disorder: Principal | ICD-10-CM | POA: Diagnosis present

## 2011-12-02 DIAGNOSIS — R112 Nausea with vomiting, unspecified: Secondary | ICD-10-CM

## 2011-12-02 DIAGNOSIS — F17200 Nicotine dependence, unspecified, uncomplicated: Secondary | ICD-10-CM | POA: Diagnosis present

## 2011-12-02 DIAGNOSIS — E860 Dehydration: Secondary | ICD-10-CM

## 2011-12-02 DIAGNOSIS — F1120 Opioid dependence, uncomplicated: Secondary | ICD-10-CM

## 2011-12-02 DIAGNOSIS — K529 Noninfective gastroenteritis and colitis, unspecified: Secondary | ICD-10-CM

## 2011-12-02 DIAGNOSIS — R5383 Other fatigue: Secondary | ICD-10-CM

## 2011-12-02 DIAGNOSIS — F1920 Other psychoactive substance dependence, uncomplicated: Secondary | ICD-10-CM | POA: Diagnosis present

## 2011-12-02 DIAGNOSIS — M549 Dorsalgia, unspecified: Secondary | ICD-10-CM

## 2011-12-02 HISTORY — DX: Anxiety disorder, unspecified: F41.9

## 2011-12-02 HISTORY — DX: Major depressive disorder, single episode, unspecified: F32.9

## 2011-12-02 HISTORY — DX: Depression, unspecified: F32.A

## 2011-12-02 LAB — COMPREHENSIVE METABOLIC PANEL
Albumin: 4.1 g/dL (ref 3.5–5.2)
BUN: 20 mg/dL (ref 6–23)
Calcium: 9.2 mg/dL (ref 8.4–10.5)
Creatinine, Ser: 0.95 mg/dL (ref 0.50–1.35)
Total Bilirubin: 0.4 mg/dL (ref 0.3–1.2)
Total Protein: 6.8 g/dL (ref 6.0–8.3)

## 2011-12-02 LAB — RAPID URINE DRUG SCREEN, HOSP PERFORMED
Benzodiazepines: NOT DETECTED
Cocaine: NOT DETECTED
Opiates: POSITIVE — AB

## 2011-12-02 LAB — CBC
HCT: 40.3 % (ref 39.0–52.0)
MCHC: 35.7 g/dL (ref 30.0–36.0)
MCV: 82.9 fL (ref 78.0–100.0)
RDW: 13.1 % (ref 11.5–15.5)

## 2011-12-02 LAB — SALICYLATE LEVEL: Salicylate Lvl: <2 mg/dL — ABNORMAL LOW (ref 2.8–20.0)

## 2011-12-02 LAB — ETHANOL: Alcohol, Ethyl (B): <11 mg/dL (ref 0–11)

## 2011-12-02 MED ORDER — MAGNESIUM HYDROXIDE 400 MG/5ML PO SUSP: 30.0000 mL | Freq: Every day | ORAL | Status: DC | PRN

## 2011-12-02 MED ORDER — ONDANSETRON 4 MG PO TBDP: 4.0000 mg | ORAL_TABLET | Freq: Four times a day (QID) | ORAL | Status: DC | PRN

## 2011-12-02 MED ORDER — CLONIDINE HCL 0.1 MG PO TABS
0.1000 mg | ORAL_TABLET | ORAL | Status: AC
  Administered 2011-12-04 – 2011-12-05 (×4): 0.1 mg via ORAL
  Filled 2011-12-02 (×4): qty 1

## 2011-12-02 MED ORDER — NAPROXEN 250 MG PO TABS: 500.0000 mg | ORAL_TABLET | Freq: Two times a day (BID) | ORAL | Status: DC | PRN

## 2011-12-02 MED ORDER — METHOCARBAMOL 500 MG PO TABS: 500.0000 mg | ORAL_TABLET | Freq: Three times a day (TID) | ORAL | Status: DC | PRN

## 2011-12-02 MED ORDER — ONDANSETRON 4 MG PO TBDP: 4.0000 mg | ORAL_TABLET | Freq: Four times a day (QID) | ORAL | Status: AC | PRN

## 2011-12-02 MED ORDER — DICYCLOMINE HCL 20 MG PO TABS
20.0000 mg | ORAL_TABLET | Freq: Four times a day (QID) | ORAL | Status: AC | PRN
  Administered 2011-12-04: 20 mg via ORAL
  Filled 2011-12-02: qty 1

## 2011-12-02 MED ORDER — DICYCLOMINE HCL 20 MG PO TABS
20.0000 mg | ORAL_TABLET | Freq: Four times a day (QID) | ORAL | Status: DC | PRN
  Filled 2011-12-02: qty 1

## 2011-12-02 MED ORDER — METHOCARBAMOL 500 MG PO TABS
500.0000 mg | ORAL_TABLET | Freq: Three times a day (TID) | ORAL | Status: AC | PRN
  Administered 2011-12-03 – 2011-12-06 (×4): 500 mg via ORAL
  Filled 2011-12-02 (×4): qty 1

## 2011-12-02 MED ORDER — LOPERAMIDE HCL 2 MG PO CAPS: 2.0000 mg | ORAL_CAPSULE | ORAL | Status: DC | PRN

## 2011-12-02 MED ORDER — IBUPROFEN 200 MG PO TABS
600.0000 mg | ORAL_TABLET | Freq: Three times a day (TID) | ORAL | Status: DC | PRN
  Administered 2011-12-02: 600 mg via ORAL
  Filled 2011-12-02: qty 3

## 2011-12-02 MED ORDER — ALUM & MAG HYDROXIDE-SIMETH 200-200-20 MG/5ML PO SUSP: 30.0000 mL | ORAL | Status: DC | PRN

## 2011-12-02 MED ORDER — LORAZEPAM 1 MG PO TABS
1.0000 mg | ORAL_TABLET | Freq: Three times a day (TID) | ORAL | Status: DC | PRN
  Administered 2011-12-02: 1 mg via ORAL
  Filled 2011-12-02: qty 1

## 2011-12-02 MED ORDER — LOPERAMIDE HCL 2 MG PO CAPS
2.0000 mg | ORAL_CAPSULE | ORAL | Status: AC | PRN
  Administered 2011-12-03 – 2011-12-04 (×3): 2 mg via ORAL
  Administered 2011-12-06: 4 mg via ORAL

## 2011-12-02 MED ORDER — NICOTINE 21 MG/24HR TD PT24
21.0000 mg | MEDICATED_PATCH | Freq: Every day | TRANSDERMAL | Status: DC
  Administered 2011-12-02: 21 mg via TRANSDERMAL
  Filled 2011-12-02: qty 1

## 2011-12-02 MED ORDER — CLONIDINE HCL 0.1 MG PO TABS
0.1000 mg | ORAL_TABLET | Freq: Four times a day (QID) | ORAL | Status: AC
  Administered 2011-12-03 (×4): 0.1 mg via ORAL
  Filled 2011-12-02 (×6): qty 1

## 2011-12-02 MED ORDER — CLONIDINE HCL 0.1 MG PO TABS
0.1000 mg | ORAL_TABLET | Freq: Every day | ORAL | Status: AC
  Administered 2011-12-06 – 2011-12-07 (×2): 0.1 mg via ORAL
  Filled 2011-12-02 (×2): qty 1

## 2011-12-02 MED ORDER — ONDANSETRON HCL 8 MG PO TABS: 4.0000 mg | ORAL_TABLET | Freq: Three times a day (TID) | ORAL | Status: DC | PRN

## 2011-12-02 MED ORDER — NAPROXEN 500 MG PO TABS
500.0000 mg | ORAL_TABLET | Freq: Two times a day (BID) | ORAL | Status: AC | PRN
  Administered 2011-12-03 – 2011-12-06 (×2): 500 mg via ORAL
  Filled 2011-12-02 (×2): qty 1

## 2011-12-02 MED ORDER — CLONIDINE HCL 0.1 MG PO TABS
0.1000 mg | ORAL_TABLET | Freq: Once | ORAL | Status: AC
  Administered 2011-12-02: 0.1 mg via ORAL
  Filled 2011-12-02: qty 1

## 2011-12-02 MED ORDER — HYDROXYZINE HCL 25 MG PO TABS: 25.0000 mg | ORAL_TABLET | Freq: Four times a day (QID) | ORAL | Status: DC | PRN

## 2011-12-02 MED ORDER — HYDROXYZINE HCL 25 MG PO TABS
25.0000 mg | ORAL_TABLET | Freq: Four times a day (QID) | ORAL | Status: AC | PRN
  Administered 2011-12-02 – 2011-12-04 (×3): 25 mg via ORAL
  Filled 2011-12-02: qty 1
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
## 2011-12-02 NOTE — ED Notes (Signed)
First meeting with patient. Patient resting and stats he is cold. Patient was given a warm blanket and offer was made to warm his breakfast tray. Patient refuses to eat at this time.

## 2011-12-02 NOTE — ED Notes (Signed)
ACT Team, Berna Spare at bedside.

## 2011-12-02 NOTE — ED Notes (Addendum)
Pt A.O. X 4. NAD. Ambulatory with steady gait. Pt wandded by security and placed in paper scrubs.   No assist needed. Belongings bagged and labeled. Stated "his wife is coming to take all his belongings home". 3 WHITE PATIENT BELONGING BAGS AT POD C NURSING STATION WITH PATIENT'S STICKER ON THEM. Respirations regular and even. Skin warm and dry. Vitals stable. Denies pain. Denies SOB. Denies N/V/D/C. Denies SI and Denies HI.  Offered food and beverage. "States I don't want anything. I just want to sleep". No further needs at this time.
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
## 2011-12-02 NOTE — ED Notes (Signed)
PA at bedside.

## 2011-12-02 NOTE — ED Provider Notes (Signed)
Patient is here requesting detox from opiates. He denies any complaints this morning.  Vital signs are stable. We'll continue to monitor waiting psychiatric placement  Celene Kras, MD 12/02/11 [phone]
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
## 2011-12-02 NOTE — Progress Notes (Signed)
Nutrition Brief Note  Patient identified on the Malnutrition Screening Tool (MST) Report  Current diet order is Regular.  Pt recently admitted for detox. Labs and medications reviewed.   Pt unavailable at time of visit.  Wt hx unknown No nutrition interventions warranted at this time. If nutrition issues arise, please consult RD. RD to check on pt for nutrition-related hx as able.  Loyce Dys, MS RD LDN Clinical Inpatient Dietitian Pager: [phone] Weekend/After hours pager: [phone]

## 2011-12-02 NOTE — Tx Team (Signed)
Initial Interdisciplinary Treatment Plan  PATIENT STRENGTHS: (choose at least two) Ability for insight Active sense of humor Average or above average intelligence Capable of independent living Communication skills Financial means General fund of knowledge Motivation for treatment/growth Physical Health Special hobby/interest Supportive family/friends Work skills  PATIENT STRESSORS: Paediatric nurse issue Marital or family conflict Substance abuse   PROBLEM LIST: Problem List/Patient Goals Date to be addressed Date deferred Reason deferred Estimated date of resolution  Substance abuse 02 Dec 2011     Depression and axiety 02 Dec 2011                                                DISCHARGE CRITERIA:  Ability to meet basic life and health needs Adequate post-discharge living arrangements Motivation to continue treatment in a less acute level of care Need for constant or close observation no longer present Verbal commitment to aftercare and medication compliance Withdrawal symptoms are absent or subacute and managed without 24-hour nursing intervention  PRELIMINARY DISCHARGE PLAN: Outpatient therapy  PATIENT/FAMIILY INVOLVEMENT: This treatment plan has been presented to and reviewed with the patient, Thomas Hays, and/or family member.  The patient and family have been given the opportunity to ask questions and make suggestions.  Izola Price Mae 12/02/2011, 3:07 PM
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
## 2011-12-02 NOTE — ED Notes (Signed)
Pt sleeping. Woken up for morning vitals. Denies pain. Denies SOB. Denies V/D/C. Denies SI/HI. Denies anxiety.  Vitals stable. Skin cool and dry. Denies need for PRN medications. Resumed resting post assessment. No further needs at this time.

## 2011-12-02 NOTE — ED Notes (Signed)
[facility] called to transport patient and patient belongings to [facility].

## 2011-12-02 NOTE — BHH Counselor (Signed)
Pt is accepted to [facility]/2[place]. Per Fannie Knee, RN pt is expected after 11 AM. Supporting documents completed and faxed to [facility], originals transported with pt.

## 2011-12-02 NOTE — ED Provider Notes (Signed)
Medical screening examination/treatment/procedure(s) were performed by non-physician practitioner and as supervising physician I was immediately available for consultation/collaboration.  Olivia Mackie, MD 12/02/11 [phone]

## 2011-12-02 NOTE — ED Notes (Signed)
Security paged to wand patient 

## 2011-12-03 DIAGNOSIS — F1123 Opioid dependence with withdrawal: Secondary | ICD-10-CM | POA: Diagnosis present

## 2011-12-03 DIAGNOSIS — F4310 Post-traumatic stress disorder, unspecified: Secondary | ICD-10-CM

## 2011-12-03 DIAGNOSIS — F1120 Opioid dependence, uncomplicated: Secondary | ICD-10-CM | POA: Diagnosis present

## 2011-12-03 DIAGNOSIS — F1920 Other psychoactive substance dependence, uncomplicated: Secondary | ICD-10-CM | POA: Diagnosis present

## 2011-12-03 DIAGNOSIS — F39 Unspecified mood [affective] disorder: Secondary | ICD-10-CM | POA: Diagnosis present

## 2011-12-03 MED ORDER — LORAZEPAM 1 MG PO TABS
1.0000 mg | ORAL_TABLET | Freq: Four times a day (QID) | ORAL | Status: DC | PRN
  Administered 2011-12-03 – 2011-12-07 (×8): 1 mg via ORAL
  Filled 2011-12-03 (×8): qty 1

## 2011-12-03 MED ORDER — NICOTINE 21 MG/24HR TD PT24
21.0000 mg | MEDICATED_PATCH | Freq: Every day | TRANSDERMAL | Status: DC
  Administered 2011-12-03 – 2011-12-08 (×4): 21 mg via TRANSDERMAL
  Filled 2011-12-03 (×2): qty 1
  Filled 2011-12-03: qty 14
  Filled 2011-12-03 (×8): qty 1

## 2011-12-03 NOTE — Progress Notes (Signed)
Patient requested jackets (one black and red and one checkered and lined with fur) that he said came in with him on admission; RN found jackets in search room and brought them to the patient.
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
## 2011-12-03 NOTE — BHH Suicide Risk Assessment (Signed)
Suicide Risk Assessment  Admission Assessment     Nursing information obtained from:  Patient Demographic factors:  Caucasian;Access to firearms Current Mental Status:    Loss Factors:  Legal issues;Financial problems / change in socioeconomic status Historical Factors:  Family history of mental illness or substance abuse Risk Reduction Factors:  Responsible for children under 31 years of age;Sense of responsibility to family;Employed;Living with another person, especially a relative;Positive social support;Positive therapeutic relationship  CLINICAL FACTORS:   Bipolar Disorder:   Depressive phase Depression:   Comorbid alcohol abuse/dependence Alcohol/Substance Abuse/Dependencies  COGNITIVE FEATURES THAT CONTRIBUTE TO RISK: No evidence   SUICIDE RISK:   Mild:  Suicidal ideation of limited frequency, intensity, duration, and specificity.  There are no identifiable plans, no associated intent, mild dysphoria and related symptoms, good self-control (both objective and subjective assessment), few other risk factors, and identifiable protective factors, including available and accessible social support.  PLAN OF CARE: Detox with clonidine, reassess mood   Thomas Hays A 12/03/2011, 9:09 AM

## 2011-12-03 NOTE — Progress Notes (Addendum)
D: Patient denies SI/HI and auditory and visual hallucinations. The patient has an anxious mood and affect. The patient rates his depression and hopelessness both a 2 out of 10 (1 low/10 high). The patient reports "feeling a little better today" and states that he feels a "little less anxious."  A: Patient given emotional support from RN. Patient encouraged to come to staff with concerns and/or questions. Patient's medication routine continued. Patient's orders and plan of care reviewed.  R: Patient remains appropriate and cooperative. Will continue to monitor patient q15 minutes for safety.

## 2011-12-03 NOTE — Clinical Social Work Note (Signed)
Aftercare Planning Group: 12/03/2011 9:45 AM  Pt did not attend d/c planning group on this date.  CSW met with pt individually at this time.  Pt states that he is detoxing off of opiates.  Pt states that he lives in [place] with his wife and kid and his opiate use is negatively affecting the family.  Pt states that he went to [facility] in September and is considering going to [facility] after detox.  CSW will assess for appropriate referrals.  No further needs voiced by pt at this time.    BHH Group Note : Clinical Social Worker Group Therapy  12/03/2011  1:15 PM  Type of Therapy:  Group Therapy - Process Group  Participation Level:  Did Not Attend group on emotion regulation   Becka Lagasse Horton, LCSWA 12/03/2011 3:00 pm

## 2011-12-03 NOTE — Progress Notes (Signed)
Psychoeducational Group Note  Date:  12/03/2011 Time:  2000  Group Topic/Focus:  Wrap-Up Group:   The focus of this group is to help patients review their daily goal of treatment and discuss progress on daily workbooks.  Participation Level:  Active  Participation Quality:  Appropriate and Sharing  Affect:  Appropriate  Cognitive:  Appropriate  Insight:  Good  Engagement in Group:  Good  Additional Comments:  Patient shared that this was the first day that he has been productive since he has been admitted. Patient wants participate in the 14 day program at [facility] upn discharge.  Lyndee Hensen 12/03/2011, 11:13 PM

## 2011-12-03 NOTE — Tx Team (Signed)
Interdisciplinary Treatment Plan Update (Adult)  Date:  12/03/2011  Time Reviewed:  9:40 AM   Progress in Treatment: Attending groups: Yes Participating in groups:  Yes Taking medication as prescribed: Yes Tolerating medication:  Yes Family/Significant othe contact made:  No Patient understands diagnosis:  Yes Discussing patient identified problems/goals with staff:  Yes Medical problems stabilized or resolved:  Yes Denies suicidal/homicidal ideation: Yes Issues/concerns per patient self-inventory:  None identified Other: N/A  New problem(s) identified: None Identified  Reason for Continuation of Hospitalization: Anxiety Depression Medication stabilization Withdrawal Symptoms  Interventions implemented related to continuation of hospitalization: mood stabilization, medication monitoring and adjustment, group therapy and psycho education, suicide risk assessment, collateral contact, aftercare planning, ongoing physician assessments and safety checks q 15 mins  Additional comments: N/A  Estimated length of stay: 3-5 days  Discharge Plan: CSW is assessing for appropriate referrals.      New goal(s): N/A  Review of initial/current patient goals per problem list:    1.  Goal (s): Address substance use by completing detox protocol  Met:  No  Target date: 12/2  As evidenced by: Completes detox on 12/2  2.  Goal (s): Reduce depressive symptoms from a 10 to a 3  Met:  No  Target date: 3-5 days  As evidenced by: Pt rates at a 6 today.    3.  Goal (s): Reduce anxiety symptoms from a 10 to a 3  Met:  No  Target date:  3-5 days  As evidenced by: Pt rates at a 10 today.     Attendees: Patient:     Family:     Physician: Geoffery Lyons, MD 12/03/2011 9:40 AM   Nursing: Roswell Miners, RN 12/03/2011 9:40 AM   Clinical Social Worker:  Reyes Ivan, LCSWA 12/03/2011  9:40 AM   Other: Nanine Means, NP 12/03/2011  9:40 AM   Other:  Verna Czech, LCSW 12/03/2011 9:44 AM    Other:  Bubba Camp, Psyc intern 12/03/2011 9:44 AM   Other:     Other:      Scribe for Treatment Team:   Reyes Ivan 12/03/2011 9:40 AM
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
## 2011-12-03 NOTE — Progress Notes (Signed)
Pt observed in the dayroom watching TV.  Pt reports he is feeling some better than he did last night.  He is still experiencing restless legs and some shakiness.  He says he still feels anxious.  He denies SI/HI/AV.  He says he is considering going to [facility] when he is detoxed from the opiates.  He makes his needs known to staff.  Support/encouragement given.  Pt voices no immediate needs at this time.  HS meds discussed.  Pt voices understanding.  Safety maintained with q15 minute checks.

## 2011-12-04 NOTE — Progress Notes (Signed)
Patient ID: Thomas Hays, male   DOB: 1980/10/18, 31 y.o.   MRN: [id number] D-  Patient reports poor sleep and improving appetite.  His energy level is low and his ability to pay attention is improving.  He reports craving, some diarrhea and feeling agitated.  His blood pressures is low, but within parameters for taking clonidine.  A- Reviewed fall precautions with patient and he knows to sit back down if he gets up and feels dizzy. He was able to name two things he is thankful for, his family and being alive.
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
## 2011-12-04 NOTE — Progress Notes (Signed)
D- Patient appropriate on unit. Complaints of mild to moderate withdrawal. A- Requested and received prn medications. Support and encouragement given. Continue current POC and evaluation of treatment goals. Continue 15' checks for safety. R- Remains safe.  Appropriate mood/behavior.
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
## 2011-12-05 NOTE — Progress Notes (Signed)
Psychoeducational Group Note  Date:  12/05/2011 Time:  2000  Group Topic/Focus:  AA group  Participation Level:  Active  Participation Quality:  Appropriate  Affect:  Appropriate  Cognitive:  Alert  Insight:  Good  Engagement in Group:  Good  Additional Comments:    Herson Prichard R 12/05/2011, 8:38 PM

## 2011-12-05 NOTE — Progress Notes (Signed)
D slept poorly last nite d/t WD s/s of agitation, cravings, anxiety and cramps, energy level is normal, ability to pay attention is good, denies Si or HI, goal today is to disassociate himself from bad/negative associates. Did not attend group, interacting w/peers in dayroom, eating meals in DR, and taking meds as ordered by MD A q50min safety checks continues and support offered, encouraged to attend group and participate and talk w/the group R Patient remains safe on the unit

## 2011-12-05 NOTE — Clinical Social Work Note (Signed)
Aftercare Planning Group: 12/05/2011 9:45 AM  Pt attended discharge planning group and actively participated in group.  CSW provided pt with today's workbook.  Pt presents with calm mood and affect.  Pt rates depression at a 4 and anxiety at a 5 today.  Pt denies SI/HI.  Pt reports feeling better today.  Pt states that he wants to go to further treatment after detox but not for 28 days.  Pt explained that he already missed Thanksgiving and doesn't want to miss Christmas with his family.  Pt states that he is open to going to [facility].  CSW attempted to call today but [facility] was not accepting treatment referrals today.  CSW will continue to assess for appropriate referrals.  No further needs voiced by pt at this time.  Safety planning and suicide prevention discussed.  Pt participated in discussion and acknowledged an understanding of the information provided.         [facility] Group Note : Clinical Social Worker Group Therapy  12/05/2011  1:15 PM  Type of Therapy:  Group Therapy - Process Group  Participation Level:  Appropriate  Participation Quality:  Appropriate   Affect:  Depressed and Flat  Cognitive:  Alert  Insight:  Good  Engagement in Group:  Good  Engagement in Therapy:  Good  Modes of Intervention:  Support  Summary of Progress/Problems: The topic of today's group was was "relapse prevention" .  Patient actively participated in group.  Discussed feeling set up to fail as he grew up in a environment full of substance users.  Discussed feeling angry and guilty regarding current relapse as he wife and her family have shown nothing but positive support and have shown him unconditional love.  Patient reports placing emphasis on forgiving himself and others as a ways to assist him in preventing relapse in the furture.  Patient also discussed desire to go into [facility] term treatment  Chelsea Horton, LCSWA 12/05/2011 3:00 pm

## 2011-12-05 NOTE — Progress Notes (Signed)
[facility] MD Progress Note  12/05/2011 3:13 PM JACQUE GARRELS  MRN:  [id number]  Diagnosis:  Opioid Dependence, Opioid Withdrawal, Mood Disorder  ADL's:  Intact  Sleep: Poor  Appetite:  Fair  Suicidal Ideation:  Plan:  Denies Intent:  Denies Means:  Denies Homicidal Ideation:  Plan:  Denies Intent:  Denies Means:  Denies  Having a hard time right now. His wife and daughter came to see him yesterday. Admits to increase in his depressed mood. Endorsed he feels very ashamed and guilty for having put the drugs in front of his family. Admits that whatever money he made went into buying pills. He is also "waking up" to the realization that he has the heroin charges pending and that he might have to pull time. He is not sure how to handle his emotions as he was dependent on the opioids to deal with them Mental Status Examination/Evaluation: Objective:  Appearance: Fairly Groomed  Patent attorney::  Fair  Speech:  Clear and Coherent and Slow  Volume:  Decreased  Mood:  Depressed, worthless, ashamed  Affect:  Depressed and Tearful  Thought Process:  Coherent and Goal Directed  Orientation:  Full  Thought Content:  Rumination and His guilt, shame for his use, for the pain he has inflicted to his family, the money he has spent that should have gone to take care of his family  Suicidal Thoughts:  Has has some thoughts as he has realized what his life has come down to, and what he has done to his family "they would be better off without me." No plans  Homicidal Thoughts:  No  Memory:  Immediate;   Fair Recent;   Fair Remote;   Fair  Judgement:  Intact  Insight:  Present  Psychomotor Activity:  Decreased  Concentration:  Fair  Recall:  Fair  Akathisia:  No  Handed:  Right  AIMS (if indicated):     Assets:  Desire for Improvement Housing Social Support  Sleep:  Number of Hours: 4    Vital Signs:Blood pressure 104/72, pulse 94, temperature 97.2 F (36.2 C), temperature source Oral, resp.  rate 18. Current Medications: Current Facility-Administered Medications  Medication Dose Route Frequency Provider Last Rate Last Dose  . alum & mag hydroxide-simeth (MAALOX/MYLANTA) 200-200-20 MG/5ML suspension 30 mL  30 mL Oral Q4H PRN Nanine Means, NP      . cloNIDine (CATAPRES) tablet 0.1 mg  0.1 mg Oral BH-qamhs Nanine Means, NP   0.1 mg at 12/05/11 4098   Followed by  . cloNIDine (CATAPRES) tablet 0.1 mg  0.1 mg Oral QAC breakfast Nanine Means, NP      . dicyclomine (BENTYL) tablet 20 mg  20 mg Oral Q6H PRN Nanine Means, NP   20 mg at 12/04/11 1335  . hydrOXYzine (ATARAX/VISTARIL) tablet 25 mg  25 mg Oral Q6H PRN Nanine Means, NP   25 mg at 12/04/11 1642  . loperamide (IMODIUM) capsule 2-4 mg  2-4 mg Oral PRN Nanine Means, NP   2 mg at 12/04/11 1335  . LORazepam (ATIVAN) tablet 1 mg  1 mg Oral Q6H PRN Rachael Fee, MD   1 mg at 12/05/11 1427  . magnesium hydroxide (MILK OF MAGNESIA) suspension 30 mL  30 mL Oral Daily PRN Nanine Means, NP      . methocarbamol (ROBAXIN) tablet 500 mg  500 mg Oral Q8H PRN Nanine Means, NP   500 mg at 12/05/11 0314  . naproxen (NAPROSYN) tablet 500 mg  500  mg Oral BID PRN Nanine Means, NP   500 mg at 12/03/11 0453  . nicotine (NICODERM CQ - dosed in mg/24 hours) patch 21 mg  21 mg Transdermal Q0600 Kerry Hough, PA   21 mg at 12/04/11 0615  . ondansetron (ZOFRAN-ODT) disintegrating tablet 4 mg  4 mg Oral Q6H PRN Nanine Means, NP        Lab Results: No results found for this or any previous visit (from the past 48 hour(s)).  Physical Findings: AIMS: Facial and Oral Movements Muscles of Facial Expression: None, normal Lips and Perioral Area: None, normal Jaw: None, normal Tongue: None, normal,Extremity Movements Upper (arms, wrists, hands, fingers): None, normal Lower (legs, knees, ankles, toes): None, normal, Trunk Movements Neck, shoulders, hips: None, normal, Overall Severity Severity of abnormal movements (highest score from questions above):  None, normal Incapacitation due to abnormal movements: None, normal Patient's awareness of abnormal movements (rate only patient's report): No Awareness, Dental Status Current problems with teeth and/or dentures?: No Does patient usually wear dentures?: No  CIWA:  CIWA-Ar Total: 1  COWS:  COWS Total Score: 6   Treatment Plan Summary: Daily contact with patient to assess and evaluate symptoms and progress in treatment Supportive approach/coping skills/relaspe prevention  Plan: Reassess for the need for an antidepressant  Karston Hyland A 12/05/2011, 3:13 PM

## 2011-12-05 NOTE — Progress Notes (Signed)
Patient ID: Thomas Hays, male   DOB: April 10, 1980, 32 y.o.   MRN: [id number] Has been resting quietly tonight,  Eyes closed, resp reg, unlabored, no c/o's voiced.  Will continue to monitor q 15 minutes for safety.  Safety maintained.

## 2011-12-05 NOTE — Progress Notes (Signed)
[facility] Group Notes:  (Counselor/Nursing/MHT/Case Management/Adjunct)  12/05/2011 11:50 AM  Type of Therapy:  Psychoeducational Skills  Participation Level:  Did Not Attend  Modes of Intervention:  Activity, Education, Problem-solving and Socialization  Summary of Progress/Problems:Pt did not participate in activity called "Picture not so perfect".   Dalia Heading 12/05/2011, 11:50 AM

## 2011-12-05 NOTE — Tx Team (Signed)
Interdisciplinary Treatment Plan Update (Adult)  Date:  12/05/2011  Time Reviewed:  9:45 AM   Progress in Treatment: Attending groups: Yes Participating in groups:  Yes Taking medication as prescribed: Yes Tolerating medication:  Yes Family/Significant othe contact made:  CSW will assess for appropriate referrals Patient understands diagnosis:  Yes Discussing patient identified problems/goals with staff:  Yes Medical problems stabilized or resolved:  Yes Denies suicidal/homicidal ideation: Yes Issues/concerns per patient self-inventory:  None identified Other: N/A  New problem(s) identified: None Identified  Reason for Continuation of Hospitalization: Anxiety Depression Medication stabilization Withdrawal symptoms  Interventions implemented related to continuation of hospitalization: mood stabilization, medication monitoring and adjustment, group therapy and psycho education, suicide risk assessment, collateral contact, aftercare planning, ongoing physician assessments and safety checks q 15 mins  Additional comments: N/A  Estimated length of stay: 3-4 days  Discharge Plan: CSW is assessing for appropriate referrals. Pt wants to go to [facility] for further treatment.     New goal(s): N/A  Review of initial/current patient goals per problem list:    1.  Goal(s): Address substance use by completing detox protocol  Met:  No  Target date: 4 days  As evidenced by: Completes detox on 12/2    2.  Goal (s): Reduce depressive symptoms from a 10 to a 3  Met:  No  Target date: 3-5 days  As evidenced by: Pt rates at a 4 today.   3.  Goal (s): Reduce anxiety symptoms from a 10 to a 3  Met:  No  Target date:  3-5 days  As evidenced by: Pt rates at an 5 today.     Attendees: Patient:     Family:     Physician: Geoffery Lyons, MD 12/05/2011 9:45 AM   Nursing: Alease Frame, RN 12/05/2011 9:45 AM   Clinical Social Worker:  Reyes Ivan, LCSWA 12/05/2011  9:45 AM   Other:  Shelda Jakes, RN 12/05/2011  9:45 AM   Other:     Other:     Other:     Other:      Scribe for Treatment Team:   Reyes Ivan 12/05/2011 9:45 AM

## 2011-12-05 NOTE — Progress Notes (Signed)
D. Pt bright and pleasant on approach.  Denies complaints, positive for evening AA group.  Interacting appropriately within milieu.  Denies SI/HI/hallucinations at this time.  A.  Support and encouragement offered   R.  Sitting in dayroom playing cards with peers in no apparent distress.  Will continue to monitor.

## 2011-12-06 DIAGNOSIS — F1910 Other psychoactive substance abuse, uncomplicated: Secondary | ICD-10-CM

## 2011-12-06 MED ORDER — GABAPENTIN 600 MG PO TABS
300.0000 mg | ORAL_TABLET | Freq: Three times a day (TID) | ORAL | Status: DC
  Filled 2011-12-06 (×6): qty 0.5

## 2011-12-06 MED ORDER — GABAPENTIN 300 MG PO CAPS
300.0000 mg | ORAL_CAPSULE | Freq: Three times a day (TID) | ORAL | Status: DC
  Administered 2011-12-06 – 2011-12-07 (×5): 300 mg via ORAL
  Filled 2011-12-06 (×7): qty 1
  Filled 2011-12-06 (×2): qty 42
  Filled 2011-12-06 (×5): qty 1
  Filled 2011-12-06: qty 42

## 2011-12-06 NOTE — Progress Notes (Signed)
Goals Group Note  Date:  12/06/2011 Time:  0900  Group Topic/Focus:  Goals Group:   The focus of this group is to help patients establish daily goals to achieve during treatment , it introduces their Saturday Patient Workbook to them and it helps motivate them to begin to take the steps they need to..in order to become a helathier individual.   Participation Level: Did Not Attend  Participation Quality:  Not Applicable  Affect:  Not Applicable  Cognitive:  Not Applicable  Insight:  Not Applicable  Engagement in Group: Not Applicable  Additional Comments:    Rich Brave 12/06/2011, 9:56 AM

## 2011-12-06 NOTE — Progress Notes (Signed)
Group Topic/Focus:  Identifying Needs:   The focus of this group is to help patients identify their personal needs that have been historically problematic and identify healthy behaviors to address their needs.  Participation Level:  Active  Participation Quality:  Appropriate  Affect:  Appropriate  Cognitive:  Appropriate  Insight:  Good  Engagement in Group:  Good  Additional Comments:    Yared Barefoot A  

## 2011-12-06 NOTE — Progress Notes (Signed)
[name] is seen out in the milieu. HE is pleasant, cooeprative and easy to get along with. HE makes good eye contact. HE completed is AM self invnentory and on it he wrote he denied SI within the past 24 hrs, he did not address his feelings of depression and/or hopelessness and he stated his DC plan includes " to stay away from negatives".  A He requested, and was given, 2 immodium, for complaints of diarrhea ( he said this helped it)his COWS at 1200 was 4 ( slight anxiety, tremulousnousness and feeling " on edge"") and he has attended his groups as planned today.  R Safety is in place and POC fostered with therapeutic relationship in place.
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
## 2011-12-06 NOTE — Clinical Social Work Note (Signed)
[facility] Group Notes:  (Clinical Social Work)  12/06/2011  10:00-11:00AM  Summary of Progress/Problems:   The main focus of today's process group was for the patient to identify ways in which they have in the past sabotaged their own recovery and reasons they may have done this/what they received from doing it.  We then worked to identify a specific plan to avoid doing this when discharged from the hospital for this admission.  The patient expressed that he wants to be called "Thomas Hays" and stated that he has self-sabotaged by avoiding treatment at this facility "for a Petties time."  Stated he knew he needed help, and avoided this until his wife caught him using.  He stated that while he is in the hospital, his wife is "wiping all my telephone numbers off the cloud" because getting in touch with dealers and friends who use would be a big self-sabotaging action for him.  He stated he now understands that "everything I have is at stake, my wife, my kid, everything."  Type of Therapy:  Group Therapy - Process  Participation Level:  Active  Participation Quality:  Attentive, Sharing and Supportive  Affect:  Appropriate and Blunted  Cognitive:  Appropriate and Oriented  Insight:  Good  Engagement in Group:  Good  Engagement in Therapy:  Good  Modes of Intervention:  Clarification, Education, Limit-setting, Problem-solving, Socialization, Support and Processing   Ambrose Mantle, LCSW 12/06/2011

## 2011-12-07 MED ORDER — IBUPROFEN 800 MG PO TABS
800.0000 mg | ORAL_TABLET | Freq: Three times a day (TID) | ORAL | Status: DC
  Administered 2011-12-07: 800 mg via ORAL
  Filled 2011-12-07: qty 1
  Filled 2011-12-07: qty 42
  Filled 2011-12-07 (×5): qty 1
  Filled 2011-12-07: qty 42
  Filled 2011-12-07 (×2): qty 1
  Filled 2011-12-07: qty 42
  Filled 2011-12-07 (×2): qty 1

## 2011-12-07 NOTE — Clinical Social Work Note (Signed)
[facility] Group Notes: (Clinical Social Work)   12/07/2011 10-11am   Type of Therapy:  Group Therapy   Participation Level:  Did Not Attend    Thomas Mun Grossman-Orr, LCSW 12/07/2011, 11:11 AM     

## 2011-12-07 NOTE — Progress Notes (Signed)
Psychoeducational Group Note  Date:  12/07/2011 Time:  1015  Group Topic/Focus:  Making Healthy Choices:   The focus of this group is to help patients identify negative/unhealthy choices they were using prior to admission and identify positive/healthier coping strategies to replace them upon discharge.  Participation Level:  Active  Participation Quality:  Appropriate  Affect:  Appropriate  Cognitive:  Appropriate  Insight:  Good  Engagement in Group:  Good  Additional Comments:    Notnamed Scholz A 12/07/2011  

## 2011-12-07 NOTE — Progress Notes (Addendum)
[name] cont to struggle with his uncle being in the hospital  ( he found out he was dying yesterday) and him needing to be here, so he can get clean and stay clean. HE says " this is a hard day". He is visibly anxious...hypervigilent and  Nervous.   A He completed his AM self inventory and on it he wrote he denied SI within the past 24 hrs and stated his DC plan is to : " keep away from acquaintances".He is medicated per MD order and given prns when he requests ( he received ativan 1 mg PO at 1304 and stateed " much" relief from anxiety 1 hr later.   R Safety is in place and POC cont with therapeutic relationship fostered.

## 2011-12-07 NOTE — Progress Notes (Signed)
D.  Pt bright and pleasant on approach, denies complaints although he does state he has chronic back pain from work and has had some aggravation with it.  Denies SI/HI/hallucinations at this time.  Interacting appropriately with peers on the unit.  Positive for evening group  A.  Medication give as ordered for back pain.  Support and encouragement offered R.  No acute distress noted, will continue to monitor.

## 2011-12-07 NOTE — Progress Notes (Signed)
[facility] Group Notes:  (Counselor/Nursing/MHT/Case Management/Adjunct)  12/06/2011  2100 Type of Therapy:  wrap up group  Participation Level:  Active  Participation Quality:  Appropriate, Attentive, Intrusive, Redirectable, Sharing and Supportive  Affect:  Appropriate  Cognitive:  Appropriate  Insight:  Good  Engagement in Group:  Good  Engagement in Therapy:  Good  Modes of Intervention:  Clarification, Education and Support  Summary of Progress/Problems: Pt said he was able to "get a lot off my chest today".  Pt reports having a hard time forgiving.  Close friend that is like family "uncle" is in the hospital on life support and pt is feeling helpless and guilty for being in the hospital and not being able to support his wife in this tough time. Although pt reports if he was discharged  he would be using because that is how he copes in difficult times.  Pt reports being grateful of  his pt peers, finding them to be supportive and sympathetic.  Shelah Lewandowsky 12/07/2011, 1:40 AM

## 2011-12-07 NOTE — Progress Notes (Signed)
[facility] MD Progress Note  12/07/2011 3:00 PM Thomas Hays  MRN:  [id number]  Diagnosis:  Opioid Dependence, withdrawal  ADL's:  Intact  Sleep: Fair  Appetite:  Fair  Suicidal Ideation:  Plan:  Denies Intent:  Denies Means:  Denies Homicidal Ideation:  Plan:  Denies Intent:  Denies Means:  Denies  Having a hard time. Thomas Hays was told that Thomas Hays Thomas Hays who has stage IV lung cancer is in Thomas Hays terminal stages and will probably die soon. Thomas Hays is conflicted because Thomas Hays wants to be there for Thomas Hays Thomas Hays and Thomas Hays and maybe see him one more time, but knows that specially under times of so much stress Thomas Hays is more vulnerable to use. Thomas Hays is concerned if Thomas Hays was to leave if Thomas Hays could get into [facility]. Thomas Hays Thomas Hays is letting him make the decision  Mental Status Examination/Evaluation: Objective:  Appearance: Fairly Groomed  Patent attorney::  Fair  Speech:  Clear and Coherent and Slow  Volume:  Decreased  Mood:  Depressed  Affect:  Depressed and Tearful  Thought Process:  Coherent and Goal Directed  Orientation:  Full  Thought Content:  conflicted, wanting to be there for Thomas Hays family, but concerned about Thomas Hays vulnerability   Suicidal Thoughts:  No  Homicidal Thoughts:  No  Memory:  Immediate;   Fair Recent;   Fair Remote;   Fair  Judgement:  Fair  Insight:  Present  Psychomotor Activity:  Normal  Concentration:  Fair  Recall:  Fair  Akathisia:  No  Handed:  Right  AIMS (if indicated):     Assets:  Communication Skills Desire for Improvement Social Support  Sleep:  Number of Hours: 5    Vital Signs:Blood pressure 115/77, pulse 91, temperature 97.4 F (36.3 C), temperature source Oral, resp. rate 16. Current Medications: Current Facility-Administered Medications  Medication Dose Route Frequency Provider Last Rate Last Dose  . alum & mag hydroxide-simeth (MAALOX/MYLANTA) 200-200-20 MG/5ML suspension 30 mL  30 mL Oral Q4H PRN Nanine Means, NP      . [COMPLETED] cloNIDine (CATAPRES) tablet 0.1 mg  0.1 mg  Oral QAC breakfast Nanine Means, NP   0.1 mg at 12/07/11 0814  . [EXPIRED] dicyclomine (BENTYL) tablet 20 mg  20 mg Oral Q6H PRN Nanine Means, NP   20 mg at 12/04/11 1335  . gabapentin (NEURONTIN) capsule 300 mg  300 mg Oral TID Rachael Fee, MD   300 mg at 12/07/11 1301  . [EXPIRED] hydrOXYzine (ATARAX/VISTARIL) tablet 25 mg  25 mg Oral Q6H PRN Nanine Means, NP   25 mg at 12/04/11 1642  . [EXPIRED] loperamide (IMODIUM) capsule 2-4 mg  2-4 mg Oral PRN Nanine Means, NP   4 mg at 12/06/11 0811  . LORazepam (ATIVAN) tablet 1 mg  1 mg Oral Q6H PRN Rachael Fee, MD   1 mg at 12/07/11 1304  . magnesium hydroxide (MILK OF MAGNESIA) suspension 30 mL  30 mL Oral Daily PRN Nanine Means, NP      . [EXPIRED] methocarbamol (ROBAXIN) tablet 500 mg  500 mg Oral Q8H PRN Nanine Means, NP   500 mg at 12/06/11 1713  . [EXPIRED] naproxen (NAPROSYN) tablet 500 mg  500 mg Oral BID PRN Nanine Means, NP   500 mg at 12/06/11 1811  . nicotine (NICODERM CQ - dosed in mg/24 hours) patch 21 mg  21 mg Transdermal Q0600 Kerry Hough, PA   21 mg at 12/06/11 8295  . [EXPIRED] ondansetron (ZOFRAN-ODT) disintegrating tablet 4 mg  4 mg Oral Q6H PRN Nanine Means, NP        Lab Results: No results found for this or any previous visit (from the past 48 hour(s)).  Physical Findings: AIMS: Facial and Oral Movements Muscles of Facial Expression: None, normal Lips and Perioral Area: None, normal Jaw: None, normal Tongue: None, normal,Extremity Movements Upper (arms, wrists, hands, fingers): None, normal Lower (legs, knees, ankles, toes): None, normal, Trunk Movements Neck, shoulders, hips: None, normal, Overall Severity Severity of abnormal movements (highest score from questions above): None, normal Incapacitation due to abnormal movements: None, normal Patient's awareness of abnormal movements (rate only patient's report): No Awareness, Dental Status Current problems with teeth and/or dentures?: No Does patient usually wear  dentures?: No  CIWA:  CIWA-Ar Total: 3  COWS:  COWS Total Score: 4   Treatment Plan Summary: Daily contact with patient to assess and evaluate symptoms and progress in treatment Medication management  Plan: Complete detox/relapse prevention/coping skills/stress management           After discussing the pros and cons, Thomas Hays decided to stay in the hopes that Thomas Hays Thomas Hays will make it longer and Thomas Hays can get a bed at [facility] in AM  Keilon Ressel A 12/07/2011, 3:00 PM

## 2011-12-07 NOTE — Progress Notes (Signed)
Patient ID: Thomas Hays, male   DOB: Dec 09, 1980, 31 y.o.   MRN: [id number] D)  Pleasant but sad, came to the med window after group this evening and talked about his grandfather having lung cancer and that he doesn't have Suell to live, wants to talk to MD in am about leaving so that he can be at the hospital.  Reminded him that the greatest gift he can give his grandfather and his family is what he's doing right now, and he agreed.  Wants to tell him how much he means to him and say good-by. A)  Has been compliant with meds and group, will continue to monitor q 15 minutes for safety.  R)  Seems focused on his grandfather's illness this evening, wants to go home.

## 2011-12-08 MED ORDER — NICOTINE 21 MG/24HR TD PT24: 21.0000 | MEDICATED_PATCH | Freq: Every day | TRANSDERMAL | Status: DC

## 2011-12-08 MED ORDER — IBUPROFEN 800 MG PO TABS: 800.0000 mg | ORAL_TABLET | Freq: Three times a day (TID) | ORAL | Status: DC

## 2011-12-08 MED ORDER — GABAPENTIN 300 MG PO CAPS: 300.0000 mg | ORAL_CAPSULE | Freq: Three times a day (TID) | ORAL | Status: DC

## 2011-12-08 NOTE — Progress Notes (Signed)
[facility] Case Management Discharge Plan:  Will you be returning to the same living situation after discharge: Yes,  returning home after treatment At discharge, do you have transportation home?:Yes,  [facility] will transport pt there Do you have the ability to pay for your medications:Yes,  access to meds   Release of information consent forms completed and in the chart;  Patient's signature needed at discharge.  Patient to Follow up at:  Follow-up Information    Follow up with [facility]. On 12/08/2011. (Will be picked at 1:00 pm today)    Contact information:   [address] [place], [place] [address] [phone]         Patient denies SI/HI:   Yes,  denies SI/HI    Safety Planning and Suicide Prevention discussed:  Yes,  discussed with pt  Barrier to discharge identified:No.  Summary and Recommendations: Pt attended discharge planning group and actively participated in group.  CSW provided pt with today's workbook.  Pt presents with calm mood and affect.  Pt denies having depression, anxiety and SI/HI.  Pt reports feeling stable to d/c today.  No recommendations from CSW.  No further needs voiced by pt.  Pt stable to discharge.     Carmina Miller 12/08/2011, 10:21 AM

## 2011-12-08 NOTE — Progress Notes (Signed)
Patient did attend the evening speaker AA meeting.  
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
## 2011-12-08 NOTE — Tx Team (Signed)
Interdisciplinary Treatment Plan Update (Adult)  Date:  12/08/2011  Time Reviewed:  10:13 AM   Progress in Treatment: Attending groups: Yes Participating in groups:  Yes Taking medication as prescribed: Yes Tolerating medication:  Yes Family/Significant othe contact made:  Yes Patient understands diagnosis:  Yes Discussing patient identified problems/goals with staff:  Yes Medical problems stabilized or resolved:  Yes Denies suicidal/homicidal ideation: Yes Issues/concerns per patient self-inventory:  None identified Other: N/A  New problem(s) identified: None Identified  Reason for Continuation of Hospitalization: Stable to d/c  Interventions implemented related to continuation of hospitalization: Stable to d/c  Additional comments: N/A  Estimated length of stay: D/C today  Discharge Plan: Pt will follow up with [facility] for further treatment.    New goal(s): N/A  Review of initial/current patient goals per problem list:    1.  Goal(s): Address substance use by completing detox protocol  Met:  Yes  Target date: 12/2  As evidenced by: detox completed today.    2.  Goal (s): Reduce depressive symptoms by reducing from a 10 to a 3  Met:  Yes  Target date: today  As evidenced by: Pt rates at a 0 today.     3.  Goal (s): Reduce anxiety symptoms by reducing from a 10 to a 3  Met:  Yes  Target date: today  As evidenced by: Pt rates at a 0 today.     Attendees: Patient:   12/08/2011 10:13 AM   Family:     Physician:  Geoffery Lyons, MD 12/08/2011 10:13 AM   Nursing: Alease Frame, RN 12/08/2011 10:13 AM   Clinical Social Worker:  Reyes Ivan, LCSWA 12/08/2011 10:13 AM   Other: Robbie Louis, RN 12/08/2011 10:13 AM   Other:  Nanine Means, NP 12/08/2011 10:16 AM   Other:     Other:     Other:      Scribe for Treatment Team:   Reyes Ivan 12/08/2011 10:13 AM
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
## 2011-12-08 NOTE — Progress Notes (Signed)
DISCHARGE NOTE: patient discharged from hospital w/scripts, samples of drugs and instructions for followup, denies SI or HI, patient going to [facility] facility for longer term treatment, is willing to take one day at a time and one issue at a time to become what he wants to be in his life and in a positive light. Sat w/wife for a few minutes until [facility] came to pick him up.

## 2011-12-11 NOTE — Progress Notes (Signed)
Patient Discharge Instructions:  After Visit Summary (AVS):   Faxed to:  12/11/11 Psychiatric Admission Assessment Note:   Faxed to:  12/11/11 Suicide Risk Assessment - Discharge Assessment:   Faxed to:  12/11/11 Faxed/Sent to the Next Level Care provider:  12/11/11 Faxed to [facility] @ [phone]  Jerelene Redden, 12/11/2011, 3:01 PM

## 2011-12-12 NOTE — Discharge Summary (Signed)
Agree with assessment and plan Loney Peto A. Amarrion Pastorino, M.D. 

## 2011-12-12 NOTE — Progress Notes (Signed)
Agree with assessment and plan Arron Tetrault A. Kensey Luepke, M.D. 

## 2012-10-25 ENCOUNTER — Encounter (HOSPITAL_COMMUNITY): Payer: Self-pay | Admitting: Emergency Medicine

## 2012-10-25 ENCOUNTER — Emergency Department (HOSPITAL_COMMUNITY)
Admission: EM | Admit: 2012-10-25 | Discharge: 2012-10-26 | Disposition: A | Discharge status: Home / Self Care | Payer: Self-pay | Attending: Emergency Medicine | Admitting: Emergency Medicine

## 2012-10-25 DIAGNOSIS — Z79899 Other long term (current) drug therapy: Secondary | ICD-10-CM | POA: Insufficient documentation

## 2012-10-25 DIAGNOSIS — F329 Major depressive disorder, single episode, unspecified: Secondary | ICD-10-CM | POA: Insufficient documentation

## 2012-10-25 DIAGNOSIS — Z791 Long term (current) use of non-steroidal anti-inflammatories (NSAID): Secondary | ICD-10-CM | POA: Insufficient documentation

## 2012-10-25 DIAGNOSIS — F1120 Opioid dependence, uncomplicated: Secondary | ICD-10-CM | POA: Insufficient documentation

## 2012-10-25 DIAGNOSIS — Z87828 Personal history of other (healed) physical injury and trauma: Secondary | ICD-10-CM | POA: Insufficient documentation

## 2012-10-25 DIAGNOSIS — F17200 Nicotine dependence, unspecified, uncomplicated: Secondary | ICD-10-CM | POA: Insufficient documentation

## 2012-10-25 DIAGNOSIS — Z880 Allergy status to penicillin: Secondary | ICD-10-CM | POA: Insufficient documentation

## 2012-10-25 DIAGNOSIS — F3289 Other specified depressive episodes: Secondary | ICD-10-CM | POA: Insufficient documentation

## 2012-10-25 DIAGNOSIS — J45909 Unspecified asthma, uncomplicated: Secondary | ICD-10-CM | POA: Insufficient documentation

## 2012-10-25 DIAGNOSIS — F411 Generalized anxiety disorder: Secondary | ICD-10-CM | POA: Insufficient documentation

## 2012-10-25 LAB — CBC
HCT: 38.1 % — ABNORMAL LOW (ref 39.0–52.0)
MCH: 29.2 pg (ref 26.0–34.0)
MCHC: 35.2 g/dL (ref 30.0–36.0)
MCV: 83 fL (ref 78.0–100.0)
Platelets: 199 10*3/uL (ref 150–400)
RDW: 13.1 % (ref 11.5–15.5)

## 2012-10-25 LAB — COMPREHENSIVE METABOLIC PANEL
Albumin: 3.7 g/dL (ref 3.5–5.2)
BUN: 18 mg/dL (ref 6–23)
Calcium: 9 mg/dL (ref 8.4–10.5)
Chloride: 97 mEq/L (ref 96–112)
Creatinine, Ser: 1.02 mg/dL (ref 0.50–1.35)
Total Bilirubin: 0.5 mg/dL (ref 0.3–1.2)
Total Protein: 6.8 g/dL (ref 6.0–8.3)

## 2012-10-25 LAB — ETHANOL: Alcohol, Ethyl (B): <11 mg/dL (ref 0–11)

## 2012-10-25 LAB — SALICYLATE LEVEL: Salicylate Lvl: <2 mg/dL — ABNORMAL LOW (ref 2.8–20.0)

## 2012-10-25 LAB — RAPID URINE DRUG SCREEN, HOSP PERFORMED
Benzodiazepines: NOT DETECTED
Opiates: POSITIVE — AB

## 2012-10-25 MED ORDER — CLONIDINE HCL 0.1 MG PO TABS: 0.1000 mg | ORAL_TABLET | Freq: Every day | ORAL | Status: DC

## 2012-10-25 MED ORDER — ONDANSETRON 4 MG PO TBDP: 4.0000 mg | ORAL_TABLET | Freq: Four times a day (QID) | ORAL | Status: DC | PRN

## 2012-10-25 MED ORDER — LORAZEPAM 1 MG PO TABS: 0.0000 mg | ORAL_TABLET | Freq: Four times a day (QID) | ORAL | Status: DC

## 2012-10-25 MED ORDER — NAPROXEN 250 MG PO TABS
500.0000 mg | ORAL_TABLET | Freq: Two times a day (BID) | ORAL | Status: DC | PRN
  Administered 2012-10-26: 500 mg via ORAL
  Filled 2012-10-25: qty 2

## 2012-10-25 MED ORDER — CLONIDINE HCL 0.1 MG PO TABS: 0.1000 mg | ORAL_TABLET | Freq: Four times a day (QID) | ORAL | Status: DC

## 2012-10-25 MED ORDER — CLONIDINE HCL 0.1 MG PO TABS: 0.1000 mg | ORAL_TABLET | ORAL | Status: DC

## 2012-10-25 MED ORDER — HYDROXYZINE HCL 25 MG PO TABS
50.0000 mg | ORAL_TABLET | Freq: Four times a day (QID) | ORAL | Status: DC | PRN
  Administered 2012-10-26: 50 mg via ORAL
  Filled 2012-10-25: qty 2

## 2012-10-25 MED ORDER — METHOCARBAMOL 500 MG PO TABS
1000.0000 mg | ORAL_TABLET | Freq: Four times a day (QID) | ORAL | Status: DC | PRN
  Administered 2012-10-26: 1000 mg via ORAL
  Filled 2012-10-25: qty 2

## 2012-10-25 MED ORDER — DICYCLOMINE HCL 20 MG PO TABS: 20.0000 mg | ORAL_TABLET | Freq: Four times a day (QID) | ORAL | Status: DC | PRN

## 2012-10-25 MED ORDER — LORAZEPAM 1 MG PO TABS: 0.0000 mg | ORAL_TABLET | Freq: Two times a day (BID) | ORAL | Status: DC

## 2012-10-25 MED ORDER — LOPERAMIDE HCL 2 MG PO CAPS: 2.0000 mg | ORAL_CAPSULE | ORAL | Status: DC | PRN

## 2012-10-25 NOTE — ED Provider Notes (Signed)
Medical screening examination/treatment/procedure(s) were performed by non-physician practitioner and as supervising physician I was immediately available for consultation/collaboration. Devoria Albe, MD, Armando Gang   Ward Givens, MD 10/25/12 [phone]
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
## 2012-10-25 NOTE — ED Notes (Addendum)
Pt states he is ready to take the next step to get off opioid use. Pt states he last used a couple of hours ago. Pt states he uses clean needles that have only been used by him. Pt states he has good support system who knows he is here. He has a wife and child at home. Pt states he has an high level of anxiety at the moment but denies any pain. Pt states he uses drugs through IV. Pt states he has been using since 32 y.o. States he only use to keep from getting sick at this point not to get high

## 2012-10-25 NOTE — ED Notes (Addendum)
Pt is requesting drug detox from Dilaudid obtained "from the streets."  Pt states last use was "couple hours ago."  Pt denies and suicidal idealation

## 2012-10-25 NOTE — ED Notes (Signed)
Pt in active tele psy session

## 2012-10-25 NOTE — ED Notes (Signed)
Tele monitor placed in patient room for call from [place] at [facility]

## 2012-10-26 ENCOUNTER — Emergency Department (HOSPITAL_COMMUNITY)
Admission: EM | Admit: 2012-10-26 | Discharge: 2012-10-27 | Disposition: A | Discharge status: Home / Self Care | Payer: Self-pay | Attending: Emergency Medicine | Admitting: Emergency Medicine

## 2012-10-26 ENCOUNTER — Encounter (HOSPITAL_COMMUNITY): Payer: Self-pay | Admitting: Emergency Medicine

## 2012-10-26 DIAGNOSIS — F17200 Nicotine dependence, unspecified, uncomplicated: Secondary | ICD-10-CM | POA: Insufficient documentation

## 2012-10-26 DIAGNOSIS — Z8709 Personal history of other diseases of the respiratory system: Secondary | ICD-10-CM | POA: Insufficient documentation

## 2012-10-26 DIAGNOSIS — F329 Major depressive disorder, single episode, unspecified: Secondary | ICD-10-CM | POA: Insufficient documentation

## 2012-10-26 DIAGNOSIS — J45909 Unspecified asthma, uncomplicated: Secondary | ICD-10-CM | POA: Insufficient documentation

## 2012-10-26 DIAGNOSIS — Z885 Allergy status to narcotic agent status: Secondary | ICD-10-CM | POA: Insufficient documentation

## 2012-10-26 DIAGNOSIS — Z880 Allergy status to penicillin: Secondary | ICD-10-CM | POA: Insufficient documentation

## 2012-10-26 DIAGNOSIS — F3289 Other specified depressive episodes: Secondary | ICD-10-CM | POA: Insufficient documentation

## 2012-10-26 DIAGNOSIS — Z888 Allergy status to other drugs, medicaments and biological substances status: Secondary | ICD-10-CM | POA: Insufficient documentation

## 2012-10-26 DIAGNOSIS — F1920 Other psychoactive substance dependence, uncomplicated: Secondary | ICD-10-CM | POA: Insufficient documentation

## 2012-10-26 DIAGNOSIS — F411 Generalized anxiety disorder: Secondary | ICD-10-CM | POA: Insufficient documentation

## 2012-10-26 LAB — COMPREHENSIVE METABOLIC PANEL
ALT: 12 U/L (ref 0–53)
AST: 15 U/L (ref 0–37)
Albumin: 4 g/dL (ref 3.5–5.2)
Alkaline Phosphatase: 78 U/L (ref 39–117)
BUN: 16 mg/dL (ref 6–23)
CO2: 28 mEq/L (ref 19–32)
Calcium: 9.3 mg/dL (ref 8.4–10.5)
Chloride: 103 mEq/L (ref 96–112)
Creatinine, Ser: 0.75 mg/dL (ref 0.50–1.35)
GFR calc Af Amer: >90 mL/min (ref >90)
GFR calc non Af Amer: >90 mL/min (ref >90)
Glucose, Bld: 111 mg/dL — ABNORMAL HIGH (ref 70–99)
Potassium: 4.5 mEq/L (ref 3.5–5.1)
Sodium: 139 mEq/L (ref 135–145)
Total Bilirubin: 0.2 mg/dL — ABNORMAL LOW (ref 0.3–1.2)
Total Protein: 6.9 g/dL (ref 6.0–8.3)

## 2012-10-26 LAB — CBC
HCT: 43 % (ref 39.0–52.0)
Hemoglobin: 15 g/dL (ref 13.0–17.0)
MCH: 29.1 pg (ref 26.0–34.0)
MCHC: 34.9 g/dL (ref 30.0–36.0)
MCV: 83.5 fL (ref 78.0–100.0)
Platelets: 230 10*3/uL (ref 150–400)
RBC: 5.15 MIL/uL (ref 4.22–5.81)
RDW: 13.2 % (ref 11.5–15.5)
WBC: 5.8 10*3/uL (ref 4.0–10.5)

## 2012-10-26 LAB — ACETAMINOPHEN LEVEL: Acetaminophen (Tylenol), Serum: <15 ug/mL (ref 10–30)

## 2012-10-26 LAB — ETHANOL: Alcohol, Ethyl (B): <11 mg/dL (ref 0–11)

## 2012-10-26 LAB — SALICYLATE LEVEL: Salicylate Lvl: <2 mg/dL — ABNORMAL LOW (ref 2.8–20.0)

## 2012-10-26 MED ORDER — ONDANSETRON HCL 4 MG PO TABS: 4.0000 mg | ORAL_TABLET | Freq: Three times a day (TID) | ORAL | Status: DC | PRN

## 2012-10-26 MED ORDER — NICOTINE 21 MG/24HR TD PT24
21.0000 mg | MEDICATED_PATCH | Freq: Every day | TRANSDERMAL | Status: DC
  Administered 2012-10-26: 21 mg via TRANSDERMAL
  Filled 2012-10-26: qty 1

## 2012-10-26 MED ORDER — IBUPROFEN 200 MG PO TABS
600.0000 mg | ORAL_TABLET | Freq: Three times a day (TID) | ORAL | Status: DC | PRN
  Administered 2012-10-26: 600 mg via ORAL
  Filled 2012-10-26 (×2): qty 1

## 2012-10-26 NOTE — BH Assessment (Signed)
[facility] Assessment Progress Note      Pt was here earlier today and had to leave due to a court date.  Thomas Hays went to court and obtained  Continuance.  His new court date is December 8th.  This Clinical research associate contacted [facility] who confirmed they still have his referral and he is accepted to their program.  He may be transported to arrive no earlier than midnight.  Spoke with Charge Nurse Jae Dire at [facility] ED who will find out if patient has transportation and if he does not, will give him a cab voucher.  Pt is not to leave ED until 11:30.

## 2012-10-26 NOTE — Progress Notes (Signed)
Thomas Hays, MHT was requested to seek detox placement for patient. Writer contacted [facility] in [place] submitted referral to [place] for review. Referral inclusive of [facility] prescreen form, labs, HPI, tele assessment. Writer received report from [place] at [facility] that patient could not be admitted there for treatment due to pending court date for today 10/26/12. Writer spoke with patient about pending court date which he admits to having and stated that his bondsman was taking care of it. Writer attempted to verify this with Thomas Hays who could not speak with Clinical research associate at time of contact. Writer contacted patients wife who confirms again that he does have a court appearance scheduled for today at 1:30pm and is attempting to have court date postponed for later date while he seeks treatment. His spouse states that he really needs treatment due to worsening of drug use and that she recently found him a  [employer] in [place] during the month of April when he had overdosed on heroin and pentanol.  At this present time patient can not be admitted to a detox facility with a schedule court appearance on today and will need to rectify his legal issues prior to seeking treatment. Writer informed Drinda Butts, RN who is also attempting assistance with an alternative plan.

## 2012-10-26 NOTE — ED Notes (Signed)
Pt reports that he was in the back on POD [facility]. States that he had to leave from there for a court date. Told to return afterwards. Pt had already been cleared and the plan was to got to [facility] ([place] Detox from opiates.

## 2012-10-26 NOTE — ED Notes (Signed)
FAXED REFERRAL TO [facility] 
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
## 2012-10-26 NOTE — ED Notes (Signed)
PT WIFE HAS ARRIVED

## 2012-10-26 NOTE — ED Notes (Signed)
[facility] at [facility], NP notified. Will d/c pt at 2330. Wife to pick him up and take to facility.

## 2012-10-26 NOTE — ED Notes (Signed)
PT ON PHONE CHECKING ON HIS RIDE.

## 2012-10-26 NOTE — ED Notes (Signed)
Wife not able to take pt. We will issue cab voucher at discharge.

## 2012-10-26 NOTE — Treatment Plan (Signed)
The plan was not for pt to come to [facility] since pt is requesting an opioid detox which is not an inpatient service provided at [facility].  Attempts were being made for pt to be accepted at [facility] or [facility] since those facilities have opioid detox programs.

## 2012-10-26 NOTE — ED Notes (Signed)
PATIENT IS GOING TO TAKE CARE OF HIS COURT APPEARANCE. HE STATES HE WILL BE BACK

## 2012-10-26 NOTE — ED Notes (Signed)
Marchelle Folks with BH speaking with pt via phone

## 2012-10-26 NOTE — ED Notes (Signed)
PT WIFE HAS CALLED AND WILL BE HERE AROUND 1135 TO TAKE HIM TO HIS COURT APPEARANCE

## 2012-10-26 NOTE — ED Notes (Signed)
SPOKE WITH [facility]. THEY ARE HAVING PROBLEMS WITH THEIR PHONE AND COMPUTER THIS MORNING. PRESENTED THE PT TO MAURICE. HE ADVISES THEY SHOULD BE ABLE TO ACCEPT PT LATER THIS EVENING. PT GIVEN NUMBER TO CALL. PT STATES WILL COME BACK HERE IF HE CANNOT BE SAFE AT HOME WHILE WAITING FOR BED TO BE AVAILABLE

## 2012-10-26 NOTE — Progress Notes (Signed)
B.Chanita Boden, MHT reviewed current disposition with Tomi Likens, attending [facility] who is recommending patient be discharged to attend to his legal issues. Writer staffed disposition with Dr. Ricki Rodriguez attending who is in agreement to discharge patient and he follow up with his court date. Writer notified patients spouse who is willing to pick him up [facility] and make his court appearance spouse reports that she is about 30 min away from [facility]. Writer notified attending nurse Drinda Butts in POD-C. Patient at this time denies SI/HI and no symptoms of psychosis. Patient may be still experiencing some mild withdrawal symptoms of body aches and moist sweats.

## 2012-10-26 NOTE — Discharge Instructions (Signed)
If you were given medicines take as directed.  If you are on coumadin or contraceptives realize their levels and effectiveness is altered by many different medicines.  If you have any reaction (rash, tongues swelling, other) to the medicines stop taking and see a physician.   Please follow up as directed and return to the ER or see a physician for new or worsening symptoms.  Thank you.  RESOURCE GUIDE  Chronic Pain Problems: Contact [facility]  [phone] Patients need to be referred by their primary care doctor.  Insufficient Money for Medicine: Contact United Way:  call [phone]  No Primary Care Doctor: - Call Health Connect  [phone] - can help you locate a primary care doctor that  accepts your insurance, provides certain services, etc. - Physician Referral Service- [phone]  Agencies that provide inexpensive medical care: - [facility]  [phone] - [facility]  [phone] - [facility]  [phone] - Women's Clinic  [phone] - Planned Parenthood  [phone] [facility]  [phone]  Medicaid-accepting [facility] Providers: - [facility]- [address]  [phone], Mon-Fri 9am-7pm, Sat 9am-1pm - [facility]- [address] [place], Suite [place]  [phone] - [facility]- [address], Suite [place]  [phone] [facility]- [address]  [phone] - Renaye Rakers- [address] [place], [address], [phone]  Only accepts [place] patients after they have their name  applied to their card  Self Pay (no insurance) in [place]: - Sickle Cell Patients - [facility]  [address] [place], [phone] - [facility]- [address] [place]  [phone]       [facility]- [address]       -     [facility]- see information  above (Speak to [employer] if you do not have insurance)       -  [facility]- [address],  [phone]       -  [facility]- [address] [phone]       -  Dr Julio Sicks-  [address], [place], [phone]       -  [facility] - [address], [phone]       -  [facility]- [address], [phone], also [address], [phone]       -     [facility]- [address] [place], [phone], 1st & 3rd Saturday         every month, 10am-1pm  -     [facility]   [address] [place], [place].   Phone:  [phone], Fax:  [phone]. Hours of Operation:  9 am - 6 pm, M-F.  -     [facility] for [facility]   [address], [place]   Phone: [phone], Fax: [phone]. Hours of Operation:  8:30 am - 5:30 pm, M-F.  [facility] [address] [place], [place] [address] [phone]  [facility] [address] [place], [place] [address] [phone]  1) Find a Doctor and Pay Out of Pocket Although you won't have to find out who  is covered by your insurance plan, it is a good idea to ask around and get recommendations. You will then need to call the office and see if the doctor you have chosen will accept you as a new patient and what types of options they offer for patients who are self-pay. Some doctors offer discounts or will set up payment plans for their patients who do not have insurance, but you will need to ask so you aren't surprised when you get to your appointment.  2) Contact Your Local Health Department Not all health departments have doctors that can see patients for sick visits, but many do, so it is worth a call to see if yours does. If you don't know where your local health department is, you can check in your phone book. The CDC also has a tool to help you locate your state's health department, and many  state websites also have listings of all of their local health departments.  3) Find a Walk-in Clinic If your illness is not likely to be very severe or complicated, you may want to try a walk in clinic. These are popping up all over the country in pharmacies, drugstores, and shopping centers. They're usually staffed by nurse practitioners or physician assistants that have been trained to treat common illnesses and complaints. They're usually fairly quick and inexpensive. However, if you have serious medical issues or chronic medical problems, these are probably not your best option  STD Testing - [facility] Department of [facility] [place], STD Clinic, [address], [place], phone [phone] or [phone].  Monday - Friday, call for an appointment. [facility] [employer], STD Clinic, [address], [place], phone [phone] or [phone].  Monday - Friday, call for an appointment.  Abuse/Neglect: [facility] Child Abuse Hotline 3122312849 [facility] Child Abuse Hotline [phone] (After Hours)  Emergency Shelter:  [facility] [phone]  Maternity Homes: - Room at the [place] [facility] [phone] - [facility] 2362032946  MRSA Hotline #:   (404)460-6721  Dental Assistance If unable to pay or uninsured, contact:  [facility]. to become qualified for the adult dental clinic.  Patients with Medicaid: [facility] [phone] [address] Joellyn Quails, [phone] [address], [phone]  If unable to pay, or uninsured, contact [facility] [phone] in [place], [phone] in [facility]) to become qualified for the adult dental clinic  [facility] [address] [place], [place] [address] [phone] [email or web address]  Other Proofreader Services: - [facility]- [address] [place], [place], [place], [address], [phone], Ext. [phone], 2nd and 4th Thursday of the month at 6:30am.  10 clients each day by appointment, can sometimes see walk-in patients if someone does not show for an appointment. [facility]- [address] [place], [place], [address], [phone] - [facility] [address], [place], [place], [address], [phone] - [facility]- [phone] [facility] Health Department- [phone] [facility] Health [phone]       Behavioral Health Resources in the [facility]  Intensive Outpatient Programs: [facility]      [address] [place], [place] [phone] Both a day and evening program       [facility] Outpatient     [address]  [place], [place] [address] [phone]         ADS: [facility] Drug S[facility] [address] [place] [phone]  [facility] Mental Health ACCESS LINE: (315) [phone] or [phone] [address] [place], [place] [address] [email or web address]   Substance Abuse Resources: - Alcohol and Drug Services  5613018456 - [facility] [phone] - [facility] [place] [phone] Floydene Flock [phone] - Residential & Outpatient Substance Abuse Program  ([phone]  Psychological Services: [facility]  [phone] [facility] Services  ([phone] - [facility], [phone] [place]. [address] [place], ACCESS LINE: [phone] or [phone], [email or web address].[email or web address]  Mobile Crisis Teams:                                        Therapeutic Alternatives         Mobile Crisis Care Unit [phone]             [facility] [address] Ginette Otto [phone]                                         Interventionist [address] [place] [place]  [place] [phone]  Self-Help/Support Groups: Mental Health Assoc. of [employer] of support groups (607)114-6996 (call for more info)  Narcotics Anonymous (NA) Caring Services [address] [place] - 2 meetings at this location  Residential Treatment Programs:  [facility]      [address]        [place]       [phone]         [facility] [address] [place] [address] [place], [place]  [address] [phone]  [facility]  [address] [place], [place] [address] [phone] Admissions: 8am-3pm M-F  [facility]     [address]        [place], [place] [address]       [phone]         [facility] [address] [place] Manns [place], [place] [phone]  [facility] [address] [place], [place] [phone]  [facility] - Intensive Outpatient      [address]     [place], [place]       [phone]         [facility] (Addiction Recovery Care [facility]oc.)     [address] [place], [place] [phone] or [phone]  Residential Treatment Services ([facility]), Medicaid [address] [place], [place] [phone]  Fellowship [address]                                               [address] [place] [phone]  [facility] Resources: [facility]- [phone]               General Therapy                                                Angie Fava,  PhD        [address] [place], [place] [address]         [phone]   Insurance  [facility] Behavioral   [address] [place], [place] [address] [phone]  [facility] [address] [place], [place] [address] [phone] Insurance/Medicaid/sponsorship through [facility] and Families                                              [address]                                         [place], [place] [address]    Therapy/tele-psych/case         [phone]          [facility] [address], [place]  [address]  Adolescent/group home/case management [phone]                                           Creola Corn PhD       General therapy       Insurance   [phone]         Dr. Lolly Mustache, Insurance, M-F [phone]  [facility]  United [employer] [facility] Dept. [address]                 [address]         [address] [place] [address]  Blondell Reveal Phone:  [phone]                                  Phone:  [phone]                   Phone:  [phone]  [facility] Mental Health, [phone] - [facility] - [facility]- [phone]       -     [facility] in [place], [address],             [phone], Insurance  [place] Child Abuse Hotline 707-251-7248 or 938-644-5525 (After Hours)

## 2012-10-26 NOTE — ED Notes (Signed)
Patient medicated for symptoms associated with opiate withdrawal. Pt reports he last used last evening around 7 pm. State he injected around 8 mg dilaudid. States he uses around 20-30 mg dilaudid (if available) daily. Or will use roxycodone 150-300 mg. [facility]. States would like to detox and transition to [facility] or [facility] for a period of time. States he is anxious. States he is beginning to have pain in his legs and hips. He has nasal congestion. Denies any gi upset at this time. He is receptive [facility] for detox

## 2012-10-26 NOTE — ED Notes (Signed)
Pt states he was unable to get bed placement a [facility] and came checked back in tonight because he felt he would relapse if he didn't come back to ED. Pt states he has aching in legs about level 7 and denies n/v. Pt has bed placement at [facility] but unable to go until after 12am per facility. Pt states will call wife to come pick him up and transport to [facility] tonight.

## 2012-10-27 NOTE — ED Notes (Signed)
Report given to [facility] is Baker,Gordon  Adoth,RN

## 2012-10-27 NOTE — ED Notes (Signed)
Belongings returned to pt

## 2012-10-28 NOTE — ED Provider Notes (Signed)
Medical screening examination/treatment/procedure(s) were performed by non-physician practitioner and as supervising physician I was immediately available for consultation/collaboration.  EKG Interpretation   None        Jadelyn Elks, MD 10/28/12 1418 

## 2012-12-07 ENCOUNTER — Emergency Department (HOSPITAL_BASED_OUTPATIENT_CLINIC_OR_DEPARTMENT_OTHER)
Admission: EM | Admit: 2012-12-07 | Discharge: 2012-12-07 | Disposition: A | Discharge status: Home / Self Care | Payer: Self-pay | Attending: Emergency Medicine | Admitting: Emergency Medicine

## 2012-12-07 ENCOUNTER — Encounter (HOSPITAL_BASED_OUTPATIENT_CLINIC_OR_DEPARTMENT_OTHER): Payer: Self-pay | Admitting: Emergency Medicine

## 2012-12-07 DIAGNOSIS — F17200 Nicotine dependence, unspecified, uncomplicated: Secondary | ICD-10-CM | POA: Insufficient documentation

## 2012-12-07 DIAGNOSIS — K0889 Other specified disorders of teeth and supporting structures: Secondary | ICD-10-CM

## 2012-12-07 DIAGNOSIS — Z8659 Personal history of other mental and behavioral disorders: Secondary | ICD-10-CM | POA: Insufficient documentation

## 2012-12-07 DIAGNOSIS — J45909 Unspecified asthma, uncomplicated: Secondary | ICD-10-CM | POA: Insufficient documentation

## 2012-12-07 DIAGNOSIS — Z880 Allergy status to penicillin: Secondary | ICD-10-CM | POA: Insufficient documentation

## 2012-12-07 DIAGNOSIS — Z87828 Personal history of other (healed) physical injury and trauma: Secondary | ICD-10-CM | POA: Insufficient documentation

## 2012-12-07 DIAGNOSIS — K089 Disorder of teeth and supporting structures, unspecified: Secondary | ICD-10-CM | POA: Insufficient documentation

## 2012-12-07 MED ORDER — IBUPROFEN 800 MG PO TABS: 800.0000 mg | ORAL_TABLET | Freq: Three times a day (TID) | ORAL | Status: DC

## 2012-12-07 MED ORDER — KETOROLAC TROMETHAMINE 60 MG/2ML IM SOLN
60.0000 mg | Freq: Once | INTRAMUSCULAR | Status: AC
  Administered 2012-12-07: 60 mg via INTRAMUSCULAR
  Filled 2012-12-07: qty 2

## 2012-12-07 MED ORDER — CLINDAMYCIN HCL 300 MG PO CAPS: 300.0000 mg | ORAL_CAPSULE | Freq: Three times a day (TID) | ORAL | Status: DC

## 2012-12-07 NOTE — ED Provider Notes (Signed)
Medical screening examination/treatment/procedure(s) were performed by non-physician practitioner and as supervising physician I was immediately available for consultation/collaboration.  EKG Interpretation   None        Courtney F Horton, MD 12/07/12 1635 

## 2012-12-07 NOTE — ED Notes (Signed)
At [facility] reports tooth pain for 2 weeks
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
## 2013-05-12 ENCOUNTER — Encounter (HOSPITAL_COMMUNITY): Payer: Self-pay | Admitting: Emergency Medicine

## 2013-05-12 ENCOUNTER — Emergency Department (INDEPENDENT_AMBULATORY_CARE_PROVIDER_SITE_OTHER)
Admission: EM | Admit: 2013-05-12 | Discharge: 2013-05-12 | Disposition: A | Discharge status: Home / Self Care | Payer: Worker's Compensation | Source: Home / Self Care | Attending: Emergency Medicine | Admitting: Emergency Medicine

## 2013-05-12 DIAGNOSIS — Y9289 Other specified places as the place of occurrence of the external cause: Secondary | ICD-10-CM

## 2013-05-12 DIAGNOSIS — S61019A Laceration without foreign body of unspecified thumb without damage to nail, initial encounter: Secondary | ICD-10-CM

## 2013-05-12 DIAGNOSIS — S61209A Unspecified open wound of unspecified finger without damage to nail, initial encounter: Secondary | ICD-10-CM

## 2013-05-12 DIAGNOSIS — M549 Dorsalgia, unspecified: Secondary | ICD-10-CM

## 2013-05-12 DIAGNOSIS — Z026 Encounter for examination for insurance purposes: Secondary | ICD-10-CM

## 2013-05-12 DIAGNOSIS — W268XXA Contact with other sharp object(s), not elsewhere classified, initial encounter: Secondary | ICD-10-CM

## 2013-05-12 MED ORDER — CYCLOBENZAPRINE HCL 10 MG PO TABS: 10.0000 mg | ORAL_TABLET | Freq: Two times a day (BID) | ORAL | Status: DC | PRN

## 2013-05-12 MED ORDER — NAPROXEN 500 MG PO TABS: 500.0000 mg | ORAL_TABLET | Freq: Two times a day (BID) | ORAL | Status: DC

## 2013-05-12 MED ORDER — DOXYCYCLINE HYCLATE 100 MG PO CAPS: 100.0000 mg | ORAL_CAPSULE | Freq: Two times a day (BID) | ORAL | Status: DC

## 2013-05-12 NOTE — ED Notes (Signed)
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  Pt is up to date on tetanus.   Also c/o lower back pain for past couple months.  Denies urinary symptoms.  Mild relief with rest and otc meds.
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
## 2013-05-12 NOTE — Discharge Instructions (Signed)
Laceration Care, Adult °A laceration is a cut or lesion that goes through all layers of the skin and into the tissue just beneath the skin. °TREATMENT  °Some lacerations may not require closure. Some lacerations may not be able to be closed due to an increased risk of infection. It is important to see your caregiver as soon as possible after an injury to minimize the risk of infection and maximize the opportunity for successful closure. °If closure is appropriate, pain medicines may be given, if needed. The wound will be cleaned to help prevent infection. Your caregiver will use stitches (sutures), staples, wound glue (adhesive), or skin adhesive strips to repair the laceration. These tools bring the skin edges together to allow for faster healing and a better cosmetic outcome. However, all wounds will heal with a scar. Once the wound has healed, scarring can be minimized by covering the wound with sunscreen during the day for 1 full year. °HOME CARE INSTRUCTIONS  °For sutures or staples: °· Keep the wound clean and dry. °· If you were given a bandage (dressing), you should change it at least once a day. Also, change the dressing if it becomes wet or dirty, or as directed by your caregiver. °· Wash the wound with soap and water 2 times a day. Rinse the wound off with water to remove all soap. Pat the wound dry with a clean towel. °· After cleaning, apply a thin layer of the antibiotic ointment as recommended by your caregiver. This will help prevent infection and keep the dressing from sticking. °· You may shower as usual after the first 24 hours. Do not soak the wound in water until the sutures are removed. °· Only take over-the-counter or prescription medicines for pain, discomfort, or fever as directed by your caregiver. °· Get your sutures or staples removed as directed by your caregiver. °For skin adhesive strips: °· Keep the wound clean and dry. °· Do not get the skin adhesive strips wet. You may bathe  carefully, using caution to keep the wound dry. °· If the wound gets wet, pat it dry with a clean towel. °· Skin adhesive strips will fall off on their own. You may trim the strips as the wound heals. Do not remove skin adhesive strips that are still stuck to the wound. They will fall off in time. °For wound adhesive: °· You may briefly wet your wound in the shower or bath. Do not soak or scrub the wound. Do not swim. Avoid periods of heavy perspiration until the skin adhesive has fallen off on its own. After showering or bathing, gently pat the wound dry with a clean towel. °· Do not apply liquid medicine, cream medicine, or ointment medicine to your wound while the skin adhesive is in place. This may loosen the film before your wound is healed. °· If a dressing is placed over the wound, be careful not to apply tape directly over the skin adhesive. This may cause the adhesive to be pulled off before the wound is healed. °· Avoid prolonged exposure to sunlight or tanning lamps while the skin adhesive is in place. Exposure to ultraviolet light in the first year will darken the scar. °· The skin adhesive will usually remain in place for 5 to 10 days, then naturally fall off the skin. Do not pick at the adhesive film. °You may need a tetanus shot if: °· You cannot remember when you had your last tetanus shot. °· You have never had a tetanus   shot. If you get a tetanus shot, your arm may swell, get red, and feel warm to the touch. This is common and not a problem. If you need a tetanus shot and you choose not to have one, there is a rare chance of getting tetanus. Sickness from tetanus can be serious. SEEK MEDICAL CARE IF:   You have redness, swelling, or increasing pain in the wound.  You see a red line that goes away from the wound.  You have yellowish-white fluid (pus) coming from the wound.  You have a fever.  You notice a bad smell coming from the wound or dressing.  Your wound breaks open before or  after sutures have been removed.  You notice something coming out of the wound such as wood or glass.  Your wound is on your hand or foot and you cannot move a finger or toe. SEEK IMMEDIATE MEDICAL CARE IF:   Your pain is not controlled with prescribed medicine.  You have severe swelling around the wound causing pain and numbness or a change in color in your arm, hand, leg, or foot.  Your wound splits open and starts bleeding.  You have worsening numbness, weakness, or loss of function of any joint around or beyond the wound.  You develop painful lumps near the wound or on the skin anywhere on your body. MAKE SURE YOU:   Understand these instructions.  Will watch your condition.  Will get help right away if you are not doing well or get worse. Document Released: 12/23/2004 Document Revised: 03/17/2011 Document Reviewed: 06/18/2010 [facility] Patient Information 2014 [place], [place].  Back Pain, Adult Low back pain is very common. About 1 in 5 people have back pain.The cause of low back pain is rarely dangerous. The pain often gets better over time.About half of people with a sudden onset of back pain feel better in just 2 weeks. About 8 in 10 people feel better by 6 weeks.  CAUSES Some common causes of back pain include:  Strain of the muscles or ligaments supporting the spine.  Wear and tear (degeneration) of the spinal discs.  Arthritis.  Direct injury to the back. DIAGNOSIS Most of the time, the direct cause of low back pain is not known.However, back pain can be treated effectively even when the exact cause of the pain is unknown.Answering your caregiver's questions about your overall health and symptoms is one of the most accurate ways to make sure the cause of your pain is not dangerous. If your caregiver needs more information, he or she may order lab work or imaging tests (X-rays or MRIs).However, even if imaging tests show changes in your back, this usually does not  require surgery. HOME CARE INSTRUCTIONS For many people, back pain returns.Since low back pain is rarely dangerous, it is often a condition that people can learn to [facility] their own.   Remain active. It is stressful on the back to sit or stand in one place. Do not sit, drive, or stand in one place for more than 30 minutes at a time. Take short walks on level surfaces as soon as pain allows.Try to increase the length of time you walk each day.  Do not stay in bed.Resting more than 1 or 2 days can delay your recovery.  Do not avoid exercise or work.Your body is made to move.It is not dangerous to be active, even though your back may hurt.Your back will likely heal faster if you return to being active before your  pain is gone.  Pay attention to your body when you bend and lift. Many people have less discomfortwhen lifting if they bend their knees, keep the load close to their bodies,and avoid twisting. Often, the most comfortable positions are those that put less stress on your recovering back.  Find a comfortable position to sleep. Use a firm mattress and lie on your side with your knees slightly bent. If you lie on your back, put a pillow under your knees.  Only take over-the-counter or prescription medicines as directed by your caregiver. Over-the-counter medicines to reduce pain and inflammation are often the most helpful.Your caregiver may prescribe muscle relaxant drugs.These medicines help dull your pain so you can more quickly return to your normal activities and healthy exercise.  Put ice on the injured area.  Put ice in a plastic bag.  Place a towel between your skin and the bag.  Leave the ice on for 15-20 minutes, 03-04 times a day for the first 2 to 3 days. After that, ice and heat may be alternated to reduce pain and spasms.  Ask your caregiver about trying back exercises and gentle massage. This may be of some benefit.  Avoid feeling anxious or stressed.Stress  increases muscle tension and can worsen back pain.It is important to recognize when you are anxious or stressed and learn ways to manage it.Exercise is a great option. SEEK MEDICAL CARE IF:  You have pain that is not relieved with rest or medicine.  You have pain that does not improve in 1 week.  You have new symptoms.  You are generally not feeling well. SEEK IMMEDIATE MEDICAL CARE IF:   You have pain that radiates from your back into your legs.  You develop new bowel or bladder control problems.  You have unusual weakness or numbness in your arms or legs.  You develop nausea or vomiting.  You develop abdominal pain.  You feel faint. Document Released: 12/23/2004 Document Revised: 06/24/2011 Document Reviewed: 05/13/2010 [facility] Patient Information 2014 [place], [place].  Back Exercises Back exercises help treat and prevent back injuries. The goal of back exercises is to increase the strength of your abdominal and back muscles and the flexibility of your back. These exercises should be started when you no longer have back pain. Back exercises include:  Pelvic Tilt. Lie on your back with your knees bent. Tilt your pelvis until the lower part of your back is against the floor. Hold this position 5 to 10 sec and repeat 5 to 10 times.  Knee to Chest. Pull first 1 knee up against your chest and hold for 20 to 30 seconds, repeat this with the other knee, and then both knees. This may be done with the other leg straight or bent, whichever feels better.  Sit-Ups or Curl-Ups. Bend your knees 90 degrees. Start with tilting your pelvis, and do a partial, slow sit-up, lifting your trunk only 30 to 45 degrees off the floor. Take at least 2 to 3 seconds for each sit-up. Do not do sit-ups with your knees out straight. If partial sit-ups are difficult, simply do the above but with only tightening your abdominal muscles and holding it as directed.  Hip-Lift. Lie on your back with your knees  flexed 90 degrees. Push down with your feet and shoulders as you raise your hips a couple inches off the floor; hold for 10 seconds, repeat 5 to 10 times.  Back arches. Lie on your stomach, propping yourself up on bent elbows. Slowly  press on your hands, causing an arch in your low back. Repeat 3 to 5 times. Any initial stiffness and discomfort should lessen with repetition over time.  Shoulder-Lifts. Lie face down with arms beside your body. Keep hips and torso pressed to floor as you slowly lift your head and shoulders off the floor. Do not overdo your exercises, especially in the beginning. Exercises may cause you some mild back discomfort which lasts for a few minutes; however, if the pain is more severe, or lasts for more than 15 minutes, do not continue exercises until you see your caregiver. Improvement with exercise therapy for back problems is slow.  See your caregivers for assistance with developing a proper back exercise program. Document Released: 01/31/2004 Document Revised: 03/17/2011 Document Reviewed: 10/24/2010 [facility] Patient Information 2014 [place], [place].

## 2013-05-13 NOTE — ED Provider Notes (Signed)
Medical screening examination/treatment/procedure(s) were performed by non-physician practitioner and as supervising physician I was immediately available for consultation/collaboration.  Leslee Homeavid Mikiya Nebergall, M.D.  Reuben Likesavid C Barrett Holthaus, MD 05/13/13 1400

## 2014-04-25 NOTE — Discharge Summary (Signed)
PATIENT NAME:  Thomas Hays, Thomas Hays MR#:  [id number] DATE OF BIRTH:  Apr 20, 1980  DATE OF ADMISSION:  09/18/2011 DATE OF DISCHARGE:  09/21/2011  ADMITTING PHYSICIAN: Marlaine HindAmir Darwish, MD DISCHARGING PHYSICIAN: Enid Baasadhika Alee Katen, MD    PRIMARY CARE PHYSIC[place].  DISCHARGE DIAGNOSES:  1. Acute colitis.  2. Systemic inflammatory response syndrome on admission due to colitis.  3. Diarrhea due to colitis.  4. Hypokalemia due to diarrhea.  5. Asthma.  DISCHARGE HOME MEDICATIONS:  1. Lorazepam 1 mg p.o. q. 8 hours p.r.n. for anxiety.  2. Flagyl 500 mg p.o. q. 8 hours for eight more days.  3. Ciprofloxacin 500 mg p.o. b.i.d. for eight more days.  4. Albuterol inhaler as needed for asthma.   DISCHARGE DIET: Advised to eat to light for the first day and then advance to a regular diet as tolerated.   DISCHARGE ACTIVITY: As tolerated.     FOLLOW-UP INSTRUCTIONS: Follow up with primary care physician in 1 to 2 weeks.   LABS AND IMAGING STUDIES PRIOR TO DISCHARGE: WBC 7.2, hemoglobin 12.9, hematocrit 36.8, platelet count 211. Sodium 144, potassium 3.3, which was replaced, chloride 111, bicarbonate 24, BUN 8, creatinine 0.82, glucose 85, calcium 8.  Stool for C. difficile is negative. Stool cultures are negative. Urinalysis negative for any infection. Urine cultures are negative. Urine tox positive for opiates which were given in the Emergency Department. Blood cultures have remained negative. CT of the abdomen and pelvis without contrast showing thickening of the wall of the sigmoid colon, could reflect diverticulitis or colitis. Appendix is normal. No acute abnormality seen elsewhere in the abdomen.   BRIEF HOSPITAL COURSE: [name] is a young, 34 year old Caucasian male with no significant past medical history other than stable asthma who presented to the ER secondary to worsening abdominal pain in the lower abdomen, low-grade fever, and also leukocytosis on admission.     Systemic  inflammatory response syndrome secondary to colitis:  He did have sigmoid colitis on CT scan and lower abdominal quadrant tenderness on exam. He was started on IV Cipro and Flagyl. He had diarrhea worsening over the first initial couple of days before it got better. His abdominal pain is better. He did have questionable history of opioid dependence according to his wife and she requested not to give any narcotics, so his pain was mostly treated with tramadol, Tylenol, and IV Toradol if needed. He was on a clear liquid diet and once he was pain free and diarrhea improved he was advanced to a regular diet and is being discharged as he is stable. He did have a low potassium while in the hospital probably from the diarrhea and that was replaced prior to discharge.   DISCHARGE CONDITION: Stable.   DISCHARGE DISPOSITION: Home.   TIME SPENT ON DISCHARGE: 40 minutes.    ____________________________ Enid Baasadhika Ellora Varnum, MD [id number] D: 09/21/2011 12:40:50 ET T: 09/21/2011 12:52:12 ET JOB#: [id number]  cc: Enid Baasadhika Flordia Kassem, MD, <Dictator> Enid BaasADHIKA Allyssia Skluzacek MD ELECTRONICALLY SIGNED 09/23/2011 17:08

## 2014-04-25 NOTE — H&P (Signed)
PATIENT NAME:  Thomas Hays, Thomas Hays MR#:  [id number] DATE OF BIRTH:  [birth year]  DATE OF ADMISSION:  09/18/2011  PRIMARY CARE PHYSICIAN[place] local doctor.  CHIEF COMPLAINT: Abdominal pain and vomiting and diarrhea.   HISTORY OF PRESENT ILLNESS: [name] is a 34 year old Caucasian male with history of asthma and tobacco abuse. He was in his usual state of health until yesterday when he started having crampy abdominal pain located in the mid lower abdomen. The severity of the pain was variable from 5 to 10 on a scale of 10. The quality of pain described as cramping that was constant with escalation and relieving. He describes some chills and low-grade fever earlier reaching 100. He had several bouts of vomiting and watery diarrhea without hematochezia and no melena. The patient is also wheezing and has cough and congestion on top of his symptoms. He came to the Emergency Department seeking evaluation and management. He had a CAT scan of the abdomen. There were suspicions about the appendix and then repeat CAT scan showed the appendix appears normal and there is thickening of the sigmoid colon consistent with colitis versus diverticulitis. His chest x-ray showed no evidence of pneumonia. The patient was admitted for further evaluation of his problems.  REVIEW OF SYSTEMS: CONSTITUTIONAL: He had low-grade fever earlier. He has chills. No fatigue. EYES: No blurring of vision. No double vision. ENT: No hearing impairment. No sore throat. No dysphagia. CARDIOVASCULAR: No chest pain. No shortness of breath, other than wheezing. No syncope. RESPIRATORY: Reports wheezing, congestion, and cough. No chest pain. GASTROINTESTINAL: Abdominal pain, nausea, vomiting, and diarrhea was reported. GENITOURINARY: No dysuria. No frequency of urination. MUSCULOSKELETAL: No joint pain or swelling. No muscular pain or swelling. INTEGUMENTARY: No skin rash. No ulcers. NEUROLOGY: No focal weakness. No seizure  activity. No headache. PSYCHIATRY: No anxiety. No depression. ENDOCRINE: No polyuria or polydipsia. No heat or cold intolerance.   PAST MEDICAL HISTORY:  1. Asthma. 2. History of pneumothorax, operated; that was more than 10 years ago.   FAMILY HISTORY: He has no information about his father. His mother has multiple psychiatric problems.   SOCIAL HABITS: Chronic smoker of one pack per day since the age of 34. No history of alcoholism. Very seldom he may use marijuana.  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   ADMISSION MEDICATIONS: Albuterol inhaler p.r.n.   ALLERGIES: Penicillin causing hives.   PHYSICAL EXAMINATION:   VITAL SIGNS: Blood pressure 115/82, respiratory rate 18, pulse 82, temperature 98.5, and oxygen saturation 98%.   GENERAL APPEARANCE: Young male lying in bed in no acute distress.   HEAD/NECK: No pallor. No icterus. No cyanosis.   EAR, NOSE, AND THROAT: Hearing was normal. Nasal mucosa, lips, and tongue were normal.   EYES: Normal iris and conjunctivae. Pupils are about 8 mm, equal and reactive to light.   NECK: Supple. Trachea at midline. No thyromegaly. No cervical lymphadenopathy. No masses.   HEART: Normal S1 and S2. No S3 or S4. No murmur. No gallop. No carotid bruits.   RESPIRATORY: Normal breathing pattern without use of accessory muscles. No rales but he has diffuse inspiratory and expiratory wheezing that is bilateral. Slight prolongation of the expiratory phase.   ABDOMEN: Soft. There is minimal tenderness at the mid lower abdomen and also on the left side. No rebound. No rigidity. No hepatosplenomegaly. No masses. No hernia.   SKIN: No ulcers. No subcutaneous nodules.   MUSCULOSKELETAL: No joint swelling.  No clubbing.   NEUROLOGIC: Cranial nerves II through XII are intact. No focal motor deficit.   PSYCHIATRY: The patient is alert and oriented x3. Mood and affect were normal.   LABORATORY, DIAGNOSTIC AND RADIOLOGIC  DATA: CAT scan of the abdomen showed there is thickening of the wall of the sigmoid colon which may reflect diverticulitis or colitis. Normal-appearing uninflamed appendix is demonstrated. There is no specific pattern of remaining loops of small bowel. There is no acute abnormality elsewhere.   Chest x-ray showed chronic obstructive pulmonary disease findings and surgical sutures in the left pulmonary apex. No acute abnormality.   CBC showed a white count of 18,000, hemoglobin 17, hematocrit 51, and platelet count 414. Serum glucose 119, BUN 21, creatinine 0.8, sodium 138, and potassium 3.9. Normal liver function tests.   Urinalysis was unremarkable.   Drug screen was positive for opiates.   ASSESSMENT:  1. Abdominal pain with picture more consistent for acute gastroenteritis. The differential diagnosis will include colitis versus diverticulitis. However, given his age and acuteness of presentation and the presence of diarrhea and vomiting, it favors gastroenteritis.  2. Mild chronic obstructive pulmonary disease exacerbation precipitated by acute bronchitis.  3. Leukocytosis secondary to the above two reasons.  4. Tobacco abuse.  5. History of asthma.  6. History of pneumothorax.   PLAN: We will admit the patient to the medical floor. IV hydration with normal saline. IV antibiotic initiated using ciprofloxacin and metronidazole. Blood cultures and urine culture were taken. I will send stool for culture as well. We will start the patient on DuoNebs every 6 hours p.r.n. I will avoid intravenous steroids since this may elevate white blood cell count further and    may confuse the picture, however, we may resort to low dose steroid if needed. Follow-up on the CBC. Pain control. The patient needs to quit abusing tobacco. We will place a nicotine patch.   TIME SPENT EVALUATING THIS PATIENT: More than 55 minutes.  ____________________________ Carney [place]ers. Rudene Re, MD amd:slb D: 09/18/2011 23:09:00  ET T: 09/19/2011 07:58:03 ET JOB#: [id number]  cc: Carney Corners. Rudene Re, MD, <Dictator> Zollie Scale MD ELECTRONICALLY SIGNED 09/20/2011 22:27

## 2015-08-10 ENCOUNTER — Encounter (HOSPITAL_COMMUNITY): Payer: Self-pay | Admitting: *Deleted

## 2015-08-10 ENCOUNTER — Ambulatory Visit (HOSPITAL_COMMUNITY)
Admission: EM | Admit: 2015-08-10 | Discharge: 2015-08-10 | Disposition: A | Discharge status: Home / Self Care | Payer: Self-pay | Attending: Emergency Medicine | Admitting: Emergency Medicine

## 2015-08-10 DIAGNOSIS — T22211A Burn of second degree of right forearm, initial encounter: Secondary | ICD-10-CM

## 2015-08-10 DIAGNOSIS — T2017XA Burn of first degree of neck, initial encounter: Secondary | ICD-10-CM

## 2015-08-10 MED ORDER — SILVER SULFADIAZINE 1 % EX CREA
TOPICAL_CREAM | Freq: Once | CUTANEOUS | Status: AC
  Administered 2015-08-10: 20:00:00 via TOPICAL

## 2015-08-10 MED ORDER — SILVER SULFADIAZINE 1 % EX CREA: 1.0000 "application " | TOPICAL_CREAM | Freq: Two times a day (BID) | CUTANEOUS | 0 refills | Status: DC

## 2015-08-10 MED ORDER — SILVER SULFADIAZINE 1 % EX CREA
TOPICAL_CREAM | CUTANEOUS | Status: AC
  Filled 2015-08-10: qty 85

## 2015-08-10 NOTE — Discharge Instructions (Signed)
°  You should change your bandages twice daily.  Gently clean burns with soap and water. Pat dry, do not rub burns.  Re-apply Silvadene cream and bandage to keep clean and protected. You may change bandage more often if it becomes wet or dirty.    Please follow up in 1 week if symptoms not improving, sooner if worsening- increased pain, fever, or difficulty moving Right arm.  You may take 500-1000mg  of acetaminophen (Tylenol) every 4-6 hours and 600-800mg  ibuprofen (Motrin or Advil) every 6-8 hours as needed for pain and swelling.
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
## 2016-08-31 ENCOUNTER — Emergency Department (HOSPITAL_COMMUNITY)
Admission: EM | Admit: 2016-08-31 | Discharge: 2016-09-01 | Disposition: A | Discharge status: Home / Self Care | Payer: Self-pay | Attending: Emergency Medicine | Admitting: Emergency Medicine

## 2016-08-31 DIAGNOSIS — M791 Myalgia, unspecified site: Secondary | ICD-10-CM

## 2016-08-31 DIAGNOSIS — R000 Tachycardia, unspecified: Secondary | ICD-10-CM | POA: Insufficient documentation

## 2016-08-31 DIAGNOSIS — J45909 Unspecified asthma, uncomplicated: Secondary | ICD-10-CM | POA: Insufficient documentation

## 2016-08-31 DIAGNOSIS — R110 Nausea: Secondary | ICD-10-CM | POA: Insufficient documentation

## 2016-08-31 DIAGNOSIS — F1490 Cocaine use, unspecified, uncomplicated: Secondary | ICD-10-CM

## 2016-08-31 DIAGNOSIS — F17210 Nicotine dependence, cigarettes, uncomplicated: Secondary | ICD-10-CM | POA: Insufficient documentation

## 2016-08-31 LAB — CBC WITH DIFFERENTIAL/PLATELET
Basophils Absolute: 0 10*3/uL (ref 0.0–0.1)
Basophils Relative: 0 %
EOS ABS: 0 10*3/uL (ref 0.0–0.7)
EOS PCT: 1 %
HCT: 37.2 % — ABNORMAL LOW (ref 39.0–52.0)
Hemoglobin: 12.9 g/dL — ABNORMAL LOW (ref 13.0–17.0)
LYMPHS ABS: 0.7 10*3/uL (ref 0.7–4.0)
Lymphocytes Relative: 18 %
MCH: 28 pg (ref 26.0–34.0)
MCHC: 34.7 g/dL (ref 30.0–36.0)
MCV: 80.7 fL (ref 78.0–100.0)
MONO ABS: 0.5 10*3/uL (ref 0.1–1.0)
MONOS PCT: 12 %
Neutro Abs: 2.8 10*3/uL (ref 1.7–7.7)
Neutrophils Relative %: 69 %
Platelets: 176 10*3/uL (ref 150–400)
RBC: 4.61 MIL/uL (ref 4.22–5.81)
RDW: 14.3 % (ref 11.5–15.5)
WBC: 4 10*3/uL (ref 4.0–10.5)

## 2016-08-31 LAB — BASIC METABOLIC PANEL
Anion gap: 8 (ref 5–15)
BUN: 13 mg/dL (ref 6–20)
CALCIUM: 8.3 mg/dL — AB (ref 8.9–10.3)
CHLORIDE: 108 mmol/L (ref 101–111)
CO2: 23 mmol/L (ref 22–32)
CREATININE: 0.6 mg/dL — AB (ref 0.61–1.24)
GFR calc Af Amer: >60 mL/min (ref >60)
GFR calc non Af Amer: >60 mL/min (ref >60)
GLUCOSE: 104 mg/dL — AB (ref 65–99)
Potassium: 3.8 mmol/L (ref 3.5–5.1)
Sodium: 139 mmol/L (ref 135–145)

## 2016-08-31 MED ORDER — ONDANSETRON HCL 4 MG/2ML IJ SOLN
4.0000 mg | Freq: Once | INTRAMUSCULAR | Status: AC
  Administered 2016-08-31: 4 mg via INTRAVENOUS
  Filled 2016-08-31: qty 2

## 2016-08-31 MED ORDER — SODIUM CHLORIDE 0.9 % IV BOLUS (SEPSIS)
1000.0000 mL | Freq: Once | INTRAVENOUS | Status: AC
  Administered 2016-08-31: 1000 mL via INTRAVENOUS

## 2016-08-31 MED ORDER — METHOCARBAMOL 500 MG PO TABS: 500.0000 mg | ORAL_TABLET | Freq: Two times a day (BID) | ORAL | 0 refills | Status: DC

## 2016-08-31 MED ORDER — ONDANSETRON 4 MG PO TBDP: 4.0000 mg | ORAL_TABLET | Freq: Three times a day (TID) | ORAL | 0 refills | Status: DC | PRN

## 2016-08-31 NOTE — Discharge Instructions (Addendum)
We recommend refraining from illicit drug use. Continued to stay well hydrated by drinking plenty of water. Should you experience discomfort at the injection site, you may apply warm compresses. May take ibuprofen, naproxen, or Tylenol for pain or discomfort. Robaxin for muscle cramps. Zofran for nausea.

## 2016-08-31 NOTE — ED Notes (Signed)
Bed: [facility] Expected date:  Expected time:  Means of arrival:  Comments: Pt in room
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
## 2016-08-31 NOTE — ED Provider Notes (Signed)
Pt seen and evaluated. D/W Harolyn Rutherford PA. Patient examined. Normal reflexs and normal lower extremity exam. No Babinski. No clonus. Normal pulses and strength and use. He complains of some intermittent muscular spasm. She is appropriate for discharge home with otherwise normal studies. He remains afebrile. Recheck here with leg weakness, worsening symptoms, or fever.   Rolland Porter, MD 08/31/16 [id number]

## 2016-10-30 DIAGNOSIS — J45909 Unspecified asthma, uncomplicated: Secondary | ICD-10-CM | POA: Insufficient documentation

## 2016-10-30 DIAGNOSIS — F17210 Nicotine dependence, cigarettes, uncomplicated: Secondary | ICD-10-CM | POA: Insufficient documentation

## 2016-10-30 DIAGNOSIS — Z79899 Other long term (current) drug therapy: Secondary | ICD-10-CM | POA: Insufficient documentation

## 2016-10-30 DIAGNOSIS — F1120 Opioid dependence, uncomplicated: Secondary | ICD-10-CM | POA: Insufficient documentation

## 2016-10-31 ENCOUNTER — Encounter (HOSPITAL_COMMUNITY): Payer: Self-pay | Admitting: Emergency Medicine

## 2016-10-31 ENCOUNTER — Emergency Department (HOSPITAL_COMMUNITY)
Admission: EM | Admit: 2016-10-31 | Discharge: 2016-10-31 | Disposition: A | Discharge status: Home / Self Care | Payer: Self-pay | Attending: Emergency Medicine | Admitting: Emergency Medicine

## 2016-10-31 DIAGNOSIS — F1120 Opioid dependence, uncomplicated: Secondary | ICD-10-CM
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
## 2016-10-31 NOTE — ED Triage Notes (Addendum)
Pt reports that [facility] Pt reports last using today. Pt states that he wants help to get off heroine. Denies SI/HI

## 2019-04-21 ENCOUNTER — Ambulatory Visit: Payer: Self-pay | Attending: Internal Medicine

## 2019-04-21 DIAGNOSIS — Z23 Encounter for immunization: Secondary | ICD-10-CM

## 2019-04-21 NOTE — Progress Notes (Signed)
   Covid-19 Vaccination Clinic  Name:  Thomas Hays    MRN: [id number] DOB: 02/11/1980  04/21/2019  [name] was observed post Covid-19 immunization for 15 minutes without incident. He was provided with Vaccine Information Sheet and instruction to access the V-Safe system.   [name] was instructed to call 911 with any severe reactions post vaccine: Marland Kitchen Difficulty breathing  . Swelling of face and throat  . A fast heartbeat  . A bad rash all over body  . Dizziness and weakness   Immunizations Administered    Name Date Dose VIS Date Route   Moderna COVID-19 Vaccine 04/21/2019 10:30 AM 0.5 mL 12/07/2018 Intramuscular   Manufacturer: Moderna   Lot: [id number]   NDC: 89338-826-66

## 2019-05-19 ENCOUNTER — Ambulatory Visit: Payer: Self-pay | Attending: Internal Medicine

## 2019-05-19 DIAGNOSIS — Z23 Encounter for immunization: Secondary | ICD-10-CM

## 2019-05-19 NOTE — Progress Notes (Signed)
   Covid-19 Vaccination Clinic  Name:  Thomas Hays    MRN: [id number] DOB: March 04, 1980  05/19/2019  [name] was observed post Covid-19 immunization for 15 minutes without incident. He was provided with Vaccine Information Sheet and instruction to access the V-Safe system.   [name] was instructed to call 911 with any severe reactions post vaccine: Marland Kitchen Difficulty breathing  . Swelling of face and throat  . A fast heartbeat  . A bad rash all over body  . Dizziness and weakness   Immunizations Administered    Name Date Dose VIS Date Route   Moderna COVID-19 Vaccine 05/19/2019 10:41 AM 0.5 mL 12/2018 Intramuscular   Manufacturer: Moderna   Lot: [id number]   NDC: 14782-956-21

## 2019-08-29 ENCOUNTER — Other Ambulatory Visit: Payer: Self-pay

## 2019-08-29 ENCOUNTER — Ambulatory Visit: Payer: Managed Care, Other (non HMO) | Admitting: Medical

## 2019-08-29 ENCOUNTER — Encounter: Payer: Self-pay | Admitting: Medical

## 2019-08-29 VITALS — BP 110/72 | HR 66 | Ht 73.5 in | Wt 201.4 lb

## 2019-08-29 DIAGNOSIS — Z0000 Encounter for general adult medical examination without abnormal findings: Secondary | ICD-10-CM

## 2019-08-29 DIAGNOSIS — F1911 Other psychoactive substance abuse, in remission: Secondary | ICD-10-CM

## 2019-08-29 DIAGNOSIS — G8929 Other chronic pain: Secondary | ICD-10-CM

## 2019-08-29 DIAGNOSIS — M545 Low back pain, unspecified: Secondary | ICD-10-CM

## 2019-08-29 DIAGNOSIS — Z13220 Encounter for screening for lipoid disorders: Secondary | ICD-10-CM

## 2019-08-29 DIAGNOSIS — Z23 Encounter for immunization: Secondary | ICD-10-CM | POA: Diagnosis not present

## 2019-08-29 NOTE — Patient Instructions (Addendum)
Please go to [facility] for your back xray.   Their hours are 8am - 4:30 pm Monday - Friday.  Take your insurance card with you.  [facility] [phone]  [address], [address] [place], [place] [address]  [address] [place], [place] [address]     Preventative Care for Adults - Male    Thank you for coming in for your well visit today, and thank you for trusting [place] with your care!  If you had a good experience today, please complete the surveys sent by [facility] and consider a review online such as Google or Health Grades, refer [place] to a friend   Maintain regular health and wellness exams:  A routine yearly physical is a good way to check in with your primary care provider about your health and preventive screening. It is also an opportunity to share updates about your health and any concerns you have, and receive a thorough all-over exam.   Most health insurance companies pay for at least some preventative services.  Check with your health plan for specific coverages.  What preventative services do men need?  Adult men should have their weight and blood pressure checked regularly.   Men age 32 and older should have their cholesterol levels checked regularly.  Beginning at age 50 and continuing to age 80, men should be screened for colorectal cancer.  Certain people may need continued testing until age 33.  Updating vaccinations is part of preventative care.  Vaccinations help protect against diseases such as the flu.  Osteoporosis is a disease in which the bones lose minerals and strength as we age. Men ages 53 and over should discuss this with their caregivers  Lab tests are generally done as part of preventative care to screen for anemia and blood disorders, to screen for problems with the kidneys and liver, to screen for bladder problems, to check blood sugar, and to check your cholesterol level.  Preventative services generally include counseling  about diet, exercise, avoiding tobacco, drugs, excessive alcohol consumption, and sexually transmitted infections.   Xrays and CT scans are not normally done as a preventative test, and most insurances do not pay for imaging for screening other than as discussed under cancer screens below.   On the other hand, if you have certain medical concerns, imaging may be necessary as a diagnostic test.    Your Medical Team Your medical team starts with [place], your PCP or primary care provider.  Please use our services for your routine care such as physicals, screenings, immunizations, sick visits, and your first stop for general medical concerns.  You can call our number for after hours information for urgent questions that may need attention but cannot wait til the next business day.    Urgent care-urgent cares exist to provide care when your primary care office would typically be closed such as evenings or weekends.   Urgent care is for evaluation of urgent medical problems that do not necessarily require emergency department care, but cannot wait til the next business day when we are open.  Emergency department care-please reserve emergency department care for serious, urgent, possibly life-threatening medical problems.  This includes issues like possible stroke, heart attack, significant injury, mental health crisis, or other urgent need that requires immediate medical attention.     See your dentist office twice yearly for hygiene and cleaning visits.   Brush your teeth and floss your teeth daily.  See your eye doctor yearly for  routine eye exam and screenings for glaucoma and retinal disease.    Vaccines:  Stay up to date with your tetanus shots and other required immunizations. You should have a booster for tetanus every 10 years. Be sure to get your flu shot every year, since 5%-20% of the [place] population comes down with the flu. The flu vaccine changes each year, so being vaccinated once is not  enough. Get your shot in the fall, before the flu season peaks.   Other vaccines to consider:  Pneumococcal vaccine to protect against certain types of pneumonia.  This is normally recommended for adults age 34 or older.  However, adults younger than 39 years old with certain underlying conditions such as diabetes, heart or lung disease should also receive the vaccine.  Shingles vaccine to protect against Varicella Zoster if you are older than age 64, or younger than 39 years old with certain underlying illness.  If you have not had the Shingrix vaccine, please call your insurer to inquire about coverage for the Shingrix vaccine given in 2 doses.   Some insurers cover this vaccine after age 33, some cover this after age 59.  If your insurer covers this, then call to schedule appointment to have this vaccine here  Hepatitis A vaccine to protect against a form of infection of the liver by a virus acquired from food.  Hepatitis B vaccine to protect against a form of infection of the liver by a virus acquired from blood or body fluids, particularly if you work in health care.  If you plan to travel internationally, check with your local health department for specific vaccination recommendations.  Human Papilloma Virus or HPV causes cancer of the cervix, and other infections that can be transmitted from person to person. There is a vaccine for HPV, and males should get immunized between the ages of 19 and 44. It requires a series of 3 shots.   Covid/Coronavirus - Please consider vaccination for your benefit and to help prevent spread of Covid to those around you.      What should I know about Cancer screening? Many types of cancers can be detected early and may often be prevented. Lung Cancer  You should be screened every year for lung cancer if: ? You are a current smoker who has smoked for at least 30 years. ? You are a former smoker who has quit within the past 15 years.  Talk to your health  care provider about your screening options, when you should start screening, and how often you should be screened.  Colorectal Cancer  Routine colorectal cancer screening usually begins at 39 years of age and should be repeated every 5-10 years until you are 39 years old. You may need to be screened more often if early forms of precancerous polyps or small growths are found. Your health care provider may recommend screening at an earlier age if you have risk factors for colon cancer.  Your health care provider may recommend using home test kits to check for hidden blood in the stool.  A small camera at the end of a tube can be used to examine your colon (sigmoidoscopy or colonoscopy). This checks for the earliest forms of colorectal cancer.  Prostate and Testicular Cancer  Depending on your age and overall health, your health care provider may do certain tests to screen for prostate and testicular cancer.  Talk to your health care provider about any symptoms or concerns you have about testicular or prostate  cancer.  Skin Cancer  Check your skin from head to toe regularly.  Tell your health care provider about any new moles or changes in moles, especially if: ? There is a change in a mole's size, shape, or color. ? You have a mole that is larger than a pencil eraser.  Always use sunscreen. Apply sunscreen liberally and repeat throughout the day.  Protect yourself by wearing Hubbs sleeves, pants, a wide-brimmed hat, and sunglasses when outside.    GENERAL RECOMMENDATIONS FOR GOOD HEALTH:  Healthy diet:  Eat a variety of foods, including fruit, vegetables, animal or vegetable protein, such as meat, fish, chicken, and eggs, or beans, lentils, tofu, and grains, such as rice.  Drink plenty of water daily.  Decrease saturated fat in the diet, avoid lots of red meat, processed foods, sweets, fast foods, and fried foods.  Exercise:  Aerobic exercise helps maintain good heart health. At  least 30-40 minutes of moderate-intensity exercise is recommended. For example, a brisk walk that increases your heart rate and breathing. This should be done on most days of the week.   Find a type of exercise or a variety of exercises that you enjoy so that it becomes a part of your daily life.  Examples are running, walking, swimming, water aerobics, and biking.  For motivation and support, explore group exercise such as aerobic class, spin class, Zumba, Yoga,or  martial arts, etc.    Set exercise goals for yourself, such as a certain weight goal, walk or run in a race such as a 5k walk/run.  Speak to your primary care provider about exercise goals.  Your weight readings per our records: Wt Readings from Last 3 Encounters:  08/29/19 201 lb 6.4 oz (91.4 kg)  10/31/16 130 lb (59 kg)  12/07/12 165 lb (74.8 kg)    Body mass index is 26.21 kg/m.     Disease prevention:  If you smoke or chew tobacco, find out from your caregiver how to quit. It can literally save your life, no matter how [place] you have been a tobacco user. If you do not use tobacco, never begin.   Maintain a healthy diet and normal weight. Increased weight leads to problems with blood pressure and diabetes.   The Body Mass Index or BMI is a way of measuring how much of your body is fat. Having a BMI above 27 increases the risk of heart disease, diabetes, hypertension, stroke and other problems related to obesity. Your caregiver can help determine your BMI and based on it develop an exercise and dietary program to help you achieve or maintain this important measurement at a healthful level.  High blood pressure causes heart and blood vessel problems.  Persistent high blood pressure should be treated with medicine if weight loss and exercise do not work.  Your blood pressure readings per our records:     BP Readings from Last 3 Encounters:  08/29/19 110/72  10/31/16 106/66  09/01/16 128/74     Fat and cholesterol  leaves deposits in your arteries that can block them. This causes heart disease and vessel disease elsewhere in your body.  If your cholesterol is found to be high, or if you have heart disease or certain other medical conditions, then you may need to have your cholesterol monitored frequently and be treated with medication.   Ask if you should have a cardiac stress test if your history suggests this. A stress test is a test done on a treadmill that looks  for heart disease. This test can find disease prior to there being a problem.   Osteoporosis is a disease in which the bones lose minerals and strength as we age. This can result in serious bone fractures. Risk of osteoporosis can be identified using a bone density scan. Men ages 45 and over should discuss this with their caregivers. Ask your caregiver whether you should be taking a calcium supplement and Vitamin D, to reduce the rate of osteoporosis.   Avoid drinking alcohol in excess (more than two drinks per day).  Avoid use of street drugs. Do not share needles with anyone. Ask for professional help if you need assistance or instructions on stopping the use of alcohol, cigarettes, and/or drugs.  Brush your teeth twice a day with fluoride toothpaste, and floss once a day. Good oral hygiene prevents tooth decay and gum disease. The problems can be painful, unattractive, and can cause other health problems. Visit your dentist for a routine oral and dental check up and preventive care every 6-12 months.      Spiritual and Emotional Health Keeping a healthy spiritual life can help you better manage your physical health. Your spiritual life can help you to cope with any issues that may arise with your physical health.  Balance can keep [place] healthy and help [place] to recover.  If you are struggling with your spiritual health there are questions that you may want to ask yourself:  What makes me feel most complete? When do I feel most connected to the  rest of the world? Where do I find the most inner strength? What am I doing when I feel whole?  Helpful tips: . Being in nature. Some people feel very connected and at peace when they are walking outdoors or are outside. Marland Kitchen Helping others. Some feel the largest sense of wellbeing when they are of service to others. Being of service can take on many forms. It can be doing volunteer work, being kind to strangers, or offering a hand to a friend in need. . Gratitude. Some people find they feel the most connected when they remain grateful. They may make lists of all the things they are grateful for or say a thank you out loud for all they have.    Emotional Health Are you in tune with your emotional health?  Check out this link: http://www.marquez-love.com/    Legal  Take the time to do a last will and testament, Advanced Directives including Health Care Power of Attorney and Living Will documents.  Don't leave your family with burdens that can be handled ahead of time.   Financial Health . Make sure you use a budget for your personal finances . Make sure you are insured against risks (health insurance, life insurance, auto insurance, etc) . Save more, spend less . Set financial goals . If you need help in this area, good resources include counseling through [employer] or other community resources, have a meeting with a Social research officer, government, and a good resource is [employer] 10 reasons people come to the doctor's office:   (what is your "ounce of prevention")  Skin disorders; Osteoarthritis and joint disorders; Back problems; Cholesterol problems; Upper respiratory conditions, excluding asthma; Anxiety, depression, and bipolar disorder; Chronic neurologic disorders; High blood pressure; Headaches and migraines; and Diabetes.      Safety:  Use seatbelts 100% of the time, whether driving or as a passenger.  Use safety devices such as hearing  protection if you work in environments with loud noise or significant background noise.  Use safety glasses when doing any work that could send debris in to the eyes.  Use a helmet if you ride a bike or motorcycle.  Use appropriate safety gear for contact sports.  Talk to your caregiver about gun safety.  Use sunscreen with a SPF (or skin protection factor) of 15 or greater.  Lighter skinned people are at a greater risk of skin cancer. Don't forget to also wear sunglasses in order to protect your eyes from too much damaging sunlight. Damaging sunlight can accelerate cataract formation.   Keep carbon monoxide and smoke detectors in your home functioning at all times. Change the batteries every 6 months or use a model that plugs into the wall.    Sexual activity: . Sex is a normal part of life and sexual activity can continue into older adulthood for many healthy people.   . If you are having erectile dysfunction issues, please follow up to discuss this further.   . If you are not in a monogamous relationship or have more than one partner, please practice safe sex.  Use condoms. Condoms are used for birth control and to help reduce the spread of sexually transmitted infections (or STIs).  Some of the STIs are gonorrhea (the clap), chlamydia, syphilis, trichomonas, herpes, HPV (human papilloma virus) and HIV (human immunodeficiency virus) which causes AIDS. The herpes, HIV and HPV are viral illnesses that have no cure. These can result in disability, cancer and death.   We are able to test for STIs here at our office.
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
## 2019-08-30 LAB — CBC WITH DIFFERENTIAL/PLATELET
Basophils Absolute: 0.1 10*3/uL (ref 0.0–0.2)
Basos: 2 %
EOS (ABSOLUTE): 0.2 10*3/uL (ref 0.0–0.4)
Eos: 4 %
Hematocrit: 44.2 % (ref 37.5–51.0)
Hemoglobin: 14.9 g/dL (ref 13.0–17.7)
Immature Grans (Abs): 0 10*3/uL (ref 0.0–0.1)
Immature Granulocytes: 0 %
Lymphocytes Absolute: 1 10*3/uL (ref 0.7–3.1)
Lymphs: 25 %
MCH: 29 pg (ref 26.6–33.0)
MCHC: 33.7 g/dL (ref 31.5–35.7)
MCV: 86 fL (ref 79–97)
Monocytes Absolute: 0.5 10*3/uL (ref 0.1–0.9)
Monocytes: 11 %
Neutrophils Absolute: 2.4 10*3/uL (ref 1.4–7.0)
Neutrophils: 58 %
Platelets: 240 10*3/uL (ref 150–450)
RBC: 5.13 x10E6/uL (ref 4.14–5.80)
RDW: 13.2 % (ref 11.6–15.4)
WBC: 4.1 10*3/uL (ref 3.4–10.8)

## 2019-08-30 LAB — COMPREHENSIVE METABOLIC PANEL
ALT: 25 IU/L (ref 0–44)
AST: 14 IU/L (ref 0–40)
Albumin/Globulin Ratio: 2.3 — ABNORMAL HIGH (ref 1.2–2.2)
Albumin: 4.6 g/dL (ref 4.0–5.0)
Alkaline Phosphatase: 60 IU/L (ref 48–121)
BUN/Creatinine Ratio: 21 — ABNORMAL HIGH (ref 9–20)
BUN: 19 mg/dL (ref 6–20)
Bilirubin Total: 0.5 mg/dL (ref 0.0–1.2)
CO2: 22 mmol/L (ref 20–29)
Calcium: 9.3 mg/dL (ref 8.7–10.2)
Chloride: 102 mmol/L (ref 96–106)
Creatinine, Ser: 0.91 mg/dL (ref 0.76–1.27)
GFR calc Af Amer: 122 mL/min/{1.73_m2} (ref >59)
GFR calc non Af Amer: 106 mL/min/{1.73_m2} (ref >59)
Globulin, Total: 2 g/dL (ref 1.5–4.5)
Glucose: 94 mg/dL (ref 65–99)
Potassium: 4.8 mmol/L (ref 3.5–5.2)
Sodium: 138 mmol/L (ref 134–144)
Total Protein: 6.6 g/dL (ref 6.0–8.5)

## 2019-08-30 LAB — LIPID PANEL
Chol/HDL Ratio: 6.8 ratio — ABNORMAL HIGH (ref 0.0–5.0)
Cholesterol, Total: 178 mg/dL (ref 100–199)
HDL: 26 mg/dL — ABNORMAL LOW (ref >39)
LDL Chol Calc (NIH): 125 mg/dL — ABNORMAL HIGH (ref 0–99)
Triglycerides: 150 mg/dL — ABNORMAL HIGH (ref 0–149)
VLDL Cholesterol Cal: 27 mg/dL (ref 5–40)

## 2019-09-26 ENCOUNTER — Telehealth: Payer: Self-pay

## 2019-09-26 NOTE — Telephone Encounter (Signed)
Pt. Aware to check with insurance and can get scheduled here for the T-dap inj.

## 2019-09-26 NOTE — Telephone Encounter (Signed)
I apologize if I did not realize he was having a baby soon.  We just updated his Td vaccine.  He can come back for the Tdap which contains the pertussis vaccine as well as the tetanus.   This wont be a problem, however insurer may not want to pay for it given the recent Td vaccine.  I would recommend he call insurance to explain that he needs pertussis vaccine given upcoming baby, and to verify out of pocket.   He can certainly come in for this.  Another option is going through health dept.   Pertussis vaccine is included as part of Tdap vaccination, and there is no separate pertussis vaccine

## 2019-09-26 NOTE — Telephone Encounter (Signed)
Pt. Called stating his girlfriend wants him to get the whooping cough vaccine because they are having a baby in the next couple of weeks. I told him I needed to run it by you first before he could get scheduled for that.

## 2019-09-30 ENCOUNTER — Other Ambulatory Visit: Payer: Self-pay

## 2019-09-30 ENCOUNTER — Other Ambulatory Visit (INDEPENDENT_AMBULATORY_CARE_PROVIDER_SITE_OTHER): Payer: Managed Care, Other (non HMO)

## 2019-09-30 DIAGNOSIS — Z23 Encounter for immunization: Secondary | ICD-10-CM | POA: Diagnosis not present

## 2019-12-19 ENCOUNTER — Ambulatory Visit: Payer: Managed Care, Other (non HMO) | Admitting: Medical

## 2019-12-19 ENCOUNTER — Other Ambulatory Visit: Payer: Self-pay

## 2019-12-19 ENCOUNTER — Encounter: Payer: Self-pay | Admitting: Medical

## 2019-12-19 VITALS — BP 110/76 | HR 77 | Ht 74.0 in | Wt 203.8 lb

## 2019-12-19 DIAGNOSIS — S83206A Unspecified tear of unspecified meniscus, current injury, right knee, initial encounter: Secondary | ICD-10-CM | POA: Diagnosis not present

## 2019-12-19 DIAGNOSIS — R29898 Other symptoms and signs involving the musculoskeletal system: Secondary | ICD-10-CM | POA: Diagnosis not present

## 2019-12-19 DIAGNOSIS — M25461 Effusion, right knee: Secondary | ICD-10-CM | POA: Insufficient documentation

## 2019-12-19 DIAGNOSIS — M25561 Pain in right knee: Secondary | ICD-10-CM | POA: Insufficient documentation

## 2019-12-19 MED ORDER — NAPROXEN 500 MG PO TABS: 500.0000 mg | ORAL_TABLET | Freq: Two times a day (BID) | ORAL | 0 refills | Status: DC
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
## 2019-12-19 NOTE — Progress Notes (Signed)
Done

## 2020-01-07 HISTORY — PX: KNEE ARTHROSCOPY: SUR90

## 2020-01-11 ENCOUNTER — Telehealth: Payer: Self-pay

## 2020-01-11 ENCOUNTER — Other Ambulatory Visit: Payer: Self-pay | Admitting: Medical

## 2020-01-11 DIAGNOSIS — S83206A Unspecified tear of unspecified meniscus, current injury, right knee, initial encounter: Secondary | ICD-10-CM

## 2020-01-11 DIAGNOSIS — R29898 Other symptoms and signs involving the musculoskeletal system: Secondary | ICD-10-CM

## 2020-01-11 DIAGNOSIS — M25561 Pain in right knee: Secondary | ICD-10-CM

## 2020-01-11 DIAGNOSIS — M25461 Effusion, right knee: Secondary | ICD-10-CM

## 2020-01-11 NOTE — Telephone Encounter (Signed)
Patient advised.

## 2020-01-11 NOTE — Telephone Encounter (Signed)
Patient has been advised of issue with insurance. He will have x-ray done tomorrow. Patient stated he is still having a lot of knee pain that's worsening. Please advise what he should do in the meantime.

## 2020-01-11 NOTE — Telephone Encounter (Signed)
Called patient, no answer. Unable to lmom for patient.

## 2020-01-11 NOTE — Telephone Encounter (Signed)
Let him know the issue with insurance.  Have him go to [facility] for knee xray.   Their hours are 8am - 4:30 pm Monday - Friday.  Take your insurance card with you.  [facility] [phone]  [address], [address] [place], [place] [address]  [address] [place], [place] [address]

## 2020-01-11 NOTE — Telephone Encounter (Signed)
Continue with some ice for swelling pain, 20 minutes at a time, relative rest, consider knee sleeve or brace if not using one.  Insurance is the hold up currently.  As soon as we get knee xray results we can again try and get MRI approved.  If we still get issues, we can just refer to ortho and let them deal with insurance

## 2020-01-11 NOTE — Telephone Encounter (Signed)
[facility] called and stated prior authorization was denied because patient has not had an x-ray of the knee first. They are requiring a peep to peer. Please advise next step.

## 2020-01-12 ENCOUNTER — Other Ambulatory Visit: Payer: Self-pay

## 2020-01-12 ENCOUNTER — Ambulatory Visit
Admission: RE | Admit: 2020-01-12 | Discharge: 2020-01-12 | Disposition: A | Discharge status: Home / Self Care | Payer: Managed Care, Other (non HMO) | Source: Ambulatory Visit | Attending: Medical | Admitting: Medical

## 2020-01-12 ENCOUNTER — Other Ambulatory Visit: Payer: Self-pay | Admitting: Medical

## 2020-01-12 DIAGNOSIS — M25561 Pain in right knee: Secondary | ICD-10-CM

## 2020-01-12 DIAGNOSIS — R29898 Other symptoms and signs involving the musculoskeletal system: Secondary | ICD-10-CM

## 2020-01-12 DIAGNOSIS — S83206A Unspecified tear of unspecified meniscus, current injury, right knee, initial encounter: Secondary | ICD-10-CM

## 2020-01-12 DIAGNOSIS — M25461 Effusion, right knee: Secondary | ICD-10-CM

## 2020-01-12 NOTE — Progress Notes (Signed)
knmr

## 2020-01-30 ENCOUNTER — Other Ambulatory Visit: Payer: Self-pay

## 2020-01-30 ENCOUNTER — Telehealth: Payer: Self-pay

## 2020-01-30 DIAGNOSIS — M25561 Pain in right knee: Secondary | ICD-10-CM

## 2020-01-30 NOTE — Telephone Encounter (Signed)
[facility] called and stated insurance denied imaging due to not having prior treatment such as physical therapy done prior.

## 2020-01-30 NOTE — Telephone Encounter (Signed)
I thought we already sent this guy to orthopedics as referral.  Since we had such a hard time with the MRI that was what I recommended weeks ago

## 2020-01-30 NOTE — Telephone Encounter (Signed)
Before it was recommended patient have x-ray before MRI. Pt got xray done and now referral to ortho is being recommended. Referral is in now. This is all request of insurance.

## 2020-01-30 NOTE — Telephone Encounter (Signed)
Somehow we got off track.   The insurance has given [place] a fit.   After the xray I requested MRI, but if we weren't able to get this ordered and approved within 3-5 days, then this should have been sent on as referral to ortho.   This is not good for him to wait this [place].   So please refer ASAP to ortho within the next few days if possible unless we have an MRI lined up within 2-3 days

## 2020-01-31 ENCOUNTER — Other Ambulatory Visit: Payer: Managed Care, Other (non HMO)

## 2020-01-31 NOTE — Telephone Encounter (Signed)
Patient has been referred to Ortho. No MRI approved.

## 2020-01-31 NOTE — Telephone Encounter (Signed)
Make sure he is aware and also aware of the difficulty dealing with insurance  Lets make sure he has appt this week since we have taken so [place] to get him treatment.

## 2020-01-31 NOTE — Telephone Encounter (Signed)
Patient has been advised and has an appointment with Ortho on next Monday.

## 2020-02-01 NOTE — Telephone Encounter (Signed)
Good. I hope he wasn't too upset with the delay

## 2020-02-06 ENCOUNTER — Ambulatory Visit: Payer: Managed Care, Other (non HMO) | Admitting: Physician Assistant

## 2020-02-06 ENCOUNTER — Encounter: Payer: Self-pay | Admitting: Physician Assistant

## 2020-02-06 DIAGNOSIS — M25561 Pain in right knee: Secondary | ICD-10-CM | POA: Diagnosis not present

## 2020-02-06 MED ORDER — METHYLPREDNISOLONE ACETATE 40 MG/ML IJ SUSP
40.0000 mg | INTRAMUSCULAR | Status: AC | PRN
  Administered 2020-02-06: 40 mg via INTRA_ARTICULAR

## 2020-02-06 MED ORDER — LIDOCAINE HCL 1 % IJ SOLN
5.0000 mL | INTRAMUSCULAR | Status: AC | PRN
  Administered 2020-02-06: 5 mL
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
## 2020-02-11 DIAGNOSIS — M25561 Pain in right knee: Secondary | ICD-10-CM

## 2020-02-17 ENCOUNTER — Telehealth: Payer: Self-pay | Admitting: Orthopaedic Surgery

## 2020-02-17 NOTE — Telephone Encounter (Signed)
Called and left pt 1X vm to call and set MRI review appt after 02/24/20 with Dr. Magnus Ivan. Will try again at another time

## 2020-02-24 ENCOUNTER — Other Ambulatory Visit: Payer: Self-pay

## 2020-02-24 ENCOUNTER — Ambulatory Visit
Admission: RE | Admit: 2020-02-24 | Discharge: 2020-02-24 | Disposition: A | Discharge status: Home / Self Care | Payer: Managed Care, Other (non HMO) | Source: Ambulatory Visit | Attending: Physician Assistant | Admitting: Physician Assistant

## 2020-02-24 DIAGNOSIS — M25561 Pain in right knee: Secondary | ICD-10-CM

## 2020-02-27 ENCOUNTER — Encounter: Payer: Self-pay | Admitting: Orthopaedic Surgery

## 2020-02-27 ENCOUNTER — Ambulatory Visit: Payer: Managed Care, Other (non HMO) | Admitting: Orthopaedic Surgery

## 2020-02-27 DIAGNOSIS — S83241D Other tear of medial meniscus, current injury, right knee, subsequent encounter: Secondary | ICD-10-CM | POA: Diagnosis not present

## 2020-02-27 NOTE — Progress Notes (Signed)
The patient comes in today to go over MRI of his right knee. He had a twisting injury to that knee on Thanksgiving and has had posterior and posterior medial right knee pain with locking catching since then. A MRI was warranted given the failed conservative treatment. He works in [employer], [employer]. He is on his knees quite a bit and still has the same pain. He is walking with a limp as well. He has had some swelling.  Examination of his right knee today does show medial joint line tenderness and a positive McMurray sign to the medial compartment. He is walking with a limp. There is a slight effusion.  MRI of his right knee does show a complex medial meniscal tear from the posterior horn to the mid body. There is only mild thinning of the articular cartilage. Of note he is 40 years old.  I showed him the MRI findings and went over knee model. We have recommended an arthroscopic intervention for his right knee. I did explain the risk and benefits of this type of surgery and the rationale behind proceeding with surgery. He does wish to proceed this as well given his continued symptoms. I described his intraoperative and postoperative course and what to do afterwards in terms of some time off from work and strength in the knee. All questions and concerns were answered addressed. We will work on getting the surgery scheduled in the near future and then will see him back at 1 week postoperative.

## 2020-03-08 ENCOUNTER — Other Ambulatory Visit: Payer: Self-pay | Admitting: Orthopaedic Surgery

## 2020-03-08 ENCOUNTER — Encounter: Payer: Self-pay | Admitting: Orthopaedic Surgery

## 2020-03-08 DIAGNOSIS — S83231D Complex tear of medial meniscus, current injury, right knee, subsequent encounter: Secondary | ICD-10-CM | POA: Diagnosis not present

## 2020-03-08 MED ORDER — HYDROCODONE-ACETAMINOPHEN 5-325 MG PO TABS: 1.0000 | ORAL_TABLET | Freq: Four times a day (QID) | ORAL | 0 refills | Status: DC | PRN

## 2020-03-15 ENCOUNTER — Ambulatory Visit (INDEPENDENT_AMBULATORY_CARE_PROVIDER_SITE_OTHER): Payer: Managed Care, Other (non HMO) | Admitting: Physician Assistant

## 2020-03-15 ENCOUNTER — Encounter: Payer: Self-pay | Admitting: Physician Assistant

## 2020-03-15 DIAGNOSIS — Z9889 Other specified postprocedural states: Secondary | ICD-10-CM

## 2020-03-15 MED ORDER — HYDROCODONE-ACETAMINOPHEN 5-325 MG PO TABS: 1.0000 | ORAL_TABLET | Freq: Four times a day (QID) | ORAL | 0 refills | Status: DC | PRN

## 2020-03-15 NOTE — Progress Notes (Signed)
HPI: Bernette Redbird returns today 1 week status post right knee arthroscopy.  He was found to have a bucket-handle medial meniscal tear.  He underwent partial meniscectomy.  Also he was found to have inflamed Hoffa's fat pad which was debrided.  Patellofemoral joint showed some minimal arthritic changes.  Lateral compartment was pristine.  Cartilage medial compartment was well-preserved.  He states his knees slightly sore but no chest pain shortness of breath.  He is slowly returning back to work.  Physical exam: Right knee port sites are healing well no signs of infection.  Calf supple nontender.  Lacks full extension by about 5 degrees and flexes to 120 degrees.  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  Discussed with him the role of low impact activities for exercise.  Offered formal therapy he defers.  We will see him back in 1 month to see how he is doing.  Questions were encouraged and answered sutures removed scar tissue mobilization encouraged

## 2020-04-04 ENCOUNTER — Encounter: Payer: Self-pay | Admitting: Family Medicine

## 2020-04-04 ENCOUNTER — Other Ambulatory Visit: Payer: Self-pay

## 2020-04-04 ENCOUNTER — Telehealth: Payer: Managed Care, Other (non HMO) | Admitting: Family Medicine

## 2020-04-04 ENCOUNTER — Other Ambulatory Visit (INDEPENDENT_AMBULATORY_CARE_PROVIDER_SITE_OTHER): Payer: Managed Care, Other (non HMO)

## 2020-04-04 VITALS — Temp 102.0°F | Wt 202.0 lb

## 2020-04-04 DIAGNOSIS — R509 Fever, unspecified: Secondary | ICD-10-CM

## 2020-04-04 DIAGNOSIS — J020 Streptococcal pharyngitis: Secondary | ICD-10-CM | POA: Diagnosis not present

## 2020-04-04 DIAGNOSIS — R059 Cough, unspecified: Secondary | ICD-10-CM | POA: Diagnosis not present

## 2020-04-04 DIAGNOSIS — R058 Other specified cough: Secondary | ICD-10-CM | POA: Diagnosis not present

## 2020-04-04 DIAGNOSIS — J029 Acute pharyngitis, unspecified: Secondary | ICD-10-CM

## 2020-04-04 LAB — POCT RAPID STREP A (OFFICE): Rapid Strep A Screen: POSITIVE — AB

## 2020-04-04 LAB — POC COVID19 BINAXNOW: SARS Coronavirus 2 Ag: NEGATIVE

## 2020-04-04 LAB — POCT INFLUENZA A/B
Influenza A, POC: NEGATIVE
Influenza B, POC: NEGATIVE

## 2020-04-04 MED ORDER — PROMETHAZINE-DM 6.25-15 MG/5ML PO SYRP: 5.0000 mL | ORAL_SOLUTION | Freq: Every evening | ORAL | 0 refills | Status: DC | PRN

## 2020-04-04 MED ORDER — CLINDAMYCIN HCL 300 MG PO CAPS: 300.0000 mg | ORAL_CAPSULE | Freq: Three times a day (TID) | ORAL | 0 refills | Status: DC

## 2020-04-04 NOTE — Progress Notes (Signed)
   Subjective:  Documentation for virtual audio and video telecommunications through [place] encounter:  This was a 2 part visit.  He came to the office parking lot for testing.  The patient was located at home. 2 patient identifiers used.  The provider was located in the office. The patient did consent to this visit and is aware of possible charges through their insurance for this visit.  The other persons participating in this telemedicine service were none. Time spent on call was 12 minutes and in review of previous records 20 minutes total.  This virtual service is not related to other E/M service within previous 7 days.   Patient ID: Thomas Hays, male    DOB: 08-Sep-1980, 40 y.o.   MRN: [id number]  HPI Chief Complaint  Patient presents with  . Sore Throat    Sore throat as of Monday, cough-since yesterday. Fever- 102. Hometest covid was negative last night   Complains of a 3 day history of fever, fatigue, sore throat and cough that is productive of green sputum.  Taking ibuprofen 600 mg and Mucinex.   Denies chills, dizziness, chest pain, palpitations, shortness of breath, abdominal pain, nausea, vomiting or diarrhea.  No history of lung disease. Does not smoke.   Reports having a negative home Covid test yesterday.   His daughter is sick with got it from her caretaker.   Covid vaccines received   Review of Systems Pertinent positives and negatives in the history of present illness.     Objective:   Physical Exam Temp (!) 102 F (38.9 C)   Wt 202 lb (91.6 kg)   BMI 25.94 kg/m   Alert and oriented in no acute distress.  Respirations unlabored.  He is able to speak in complete sentences.  Congested cough during the visit.  Speech is normal.      Assessment & Plan:  Fever, unspecified fever cause  Productive cough - Plan: promethazine-dextromethorphan (PROMETHAZINE-DM) 6.25-15 MG/5ML syrup  Acute pharyngitis, unspecified etiology  Strep pharyngitis -  Plan: clindamycin (CLEOCIN) 300 MG capsule  He came to the office parking lot for rapid flu and Covid testing as well as a rapid strep test. Rapid strep test positive.  I will treat him with clindamycin due to penicillin allergy.  Encouraged to take a probiotic or eat yogurt with live cultures.  Promethazine DM also prescribed for cough.  He is aware of the sedating nature of this medication.  He may take Mucinex DM during the day.  Encourage salt water gargles, increase hydration, ibuprofen or Tylenol as well as Chloraseptic.  Discussed the contagious nature of strep throat and he should change his toothbrush after being on the antibiotic for at least 24 hours.  Follow-up if worsening or not back to baseline when he completes the antibiotic.

## 2020-04-04 NOTE — Progress Notes (Signed)
Please let him know that I will send an antibiotic to his pharmacy for strep throat.  He was negative for flu and rapid Covid.  Ask him to throw away his toothbrush and use a new one in 24 hours Avoid sharing beverages or food with other people due to this being highly contagious.

## 2020-04-05 LAB — NOVEL CORONAVIRUS, NAA: SARS-CoV-2, NAA: NOT DETECTED

## 2020-04-05 LAB — SARS-COV-2, NAA 2 DAY TAT

## 2020-04-11 ENCOUNTER — Ambulatory Visit: Payer: Managed Care, Other (non HMO) | Admitting: Orthopaedic Surgery

## 2020-04-18 ENCOUNTER — Ambulatory Visit: Payer: Managed Care, Other (non HMO) | Admitting: Orthopaedic Surgery

## 2020-07-05 ENCOUNTER — Telehealth: Payer: Self-pay

## 2020-07-05 NOTE — Telephone Encounter (Signed)
Pt called and he is positive with covid. Pt has been sick for four days and today is his worse day ( per pt). Pt was advised to treat symptoms and get rest. Please advise if there is any thing else that pt needs to do or if you can send in med. Please advise. KH

## 2020-07-05 NOTE — Telephone Encounter (Signed)
Lvm for pt KH 

## 2020-12-25 ENCOUNTER — Telehealth: Payer: Self-pay | Admitting: Medical

## 2020-12-25 NOTE — Telephone Encounter (Signed)
Pt called and states that his third child was just born and her would like a referral for a vasectomy. Please advise pt at [phone].

## 2020-12-26 ENCOUNTER — Other Ambulatory Visit: Payer: Self-pay

## 2020-12-26 DIAGNOSIS — Z3009 Encounter for other general counseling and advice on contraception: Secondary | ICD-10-CM

## 2021-02-04 ENCOUNTER — Ambulatory Visit: Payer: Self-pay | Admitting: Medical

## 2021-02-04 ENCOUNTER — Other Ambulatory Visit: Payer: Self-pay

## 2021-02-04 ENCOUNTER — Encounter: Payer: Self-pay | Admitting: Medical

## 2021-02-04 VITALS — BP 108/80 | HR 79 | Temp 97.7°F | Wt 206.2 lb

## 2021-02-04 DIAGNOSIS — Z23 Encounter for immunization: Secondary | ICD-10-CM

## 2021-02-04 DIAGNOSIS — K529 Noninfective gastroenteritis and colitis, unspecified: Secondary | ICD-10-CM

## 2021-02-04 DIAGNOSIS — R110 Nausea: Secondary | ICD-10-CM

## 2021-02-04 MED ORDER — ONDANSETRON HCL 4 MG PO TABS: 4.0000 mg | ORAL_TABLET | Freq: Three times a day (TID) | ORAL | 0 refills | Status: DC | PRN

## 2021-02-04 NOTE — Progress Notes (Signed)
Left message for pt that med was sent to pharmacy
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
## 2021-03-20 ENCOUNTER — Telehealth: Payer: Self-pay | Admitting: Medical

## 2021-03-20 ENCOUNTER — Encounter: Payer: Self-pay | Admitting: Medical

## 2021-03-20 NOTE — Telephone Encounter (Signed)
This patient no showed for their appointment today.Which of the following is necessary for this patient.  ? ?A) No follow-up necessary  ? ?B) Follow-up urgent. Locate Patient Immediately.  ? ?C) Follow-up necessary. Contact patient and Schedule visit in ____ Days.  ? ?D) Follow-up Advised. Contact patient and Schedule visit in ____ Days.  ? ?E) Please Send no show letter to patient.  ?

## 2021-03-20 NOTE — Telephone Encounter (Signed)
forwarding

## 2021-03-25 ENCOUNTER — Encounter: Payer: Self-pay | Admitting: Medical

## 2021-09-11 ENCOUNTER — Encounter: Payer: Self-pay | Admitting: Internal Medicine

## 2021-10-15 ENCOUNTER — Encounter: Payer: Self-pay | Admitting: Internal Medicine

## 2021-11-20 ENCOUNTER — Ambulatory Visit: Payer: Self-pay | Admitting: Medical

## 2021-11-25 ENCOUNTER — Ambulatory Visit: Payer: Managed Care, Other (non HMO) | Admitting: Medical

## 2021-11-25 VITALS — BP 120/74 | HR 68 | Temp 97.8°F | Wt 209.0 lb

## 2021-11-25 DIAGNOSIS — M25552 Pain in left hip: Secondary | ICD-10-CM | POA: Diagnosis not present

## 2021-11-25 DIAGNOSIS — G8929 Other chronic pain: Secondary | ICD-10-CM | POA: Diagnosis not present

## 2021-11-25 DIAGNOSIS — F430 Acute stress reaction: Secondary | ICD-10-CM | POA: Diagnosis not present

## 2021-11-25 DIAGNOSIS — M545 Low back pain, unspecified: Secondary | ICD-10-CM

## 2021-11-25 MED ORDER — TIZANIDINE HCL 4 MG PO TABS: 4.0000 mg | ORAL_TABLET | Freq: Two times a day (BID) | ORAL | 0 refills | Status: DC | PRN

## 2021-11-25 MED ORDER — MELOXICAM 15 MG PO TABS: 15.0000 mg | ORAL_TABLET | Freq: Every day | ORAL | 0 refills | Status: DC
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
## 2021-11-25 NOTE — Patient Instructions (Signed)
Please go to [facility] for your back and left xray.   Their hours are 8am - 4:30 pm Monday - Friday.  Take your insurance card with you.  [facility] [phone]  [address], [address] [place], [place] [address]  [address] [place], [place] [address]

## 2022-10-30 ENCOUNTER — Encounter: Payer: Managed Care, Other (non HMO) | Admitting: Medical

## 2022-12-17 ENCOUNTER — Encounter: Payer: Self-pay | Admitting: Medical

## 2022-12-17 ENCOUNTER — Ambulatory Visit: Payer: Managed Care, Other (non HMO) | Admitting: Medical

## 2022-12-17 VITALS — BP 120/80 | HR 85 | Ht 74.0 in | Wt 218.6 lb

## 2022-12-17 DIAGNOSIS — M62830 Muscle spasm of back: Secondary | ICD-10-CM | POA: Diagnosis not present

## 2022-12-17 DIAGNOSIS — Z13220 Encounter for screening for lipoid disorders: Secondary | ICD-10-CM

## 2022-12-17 DIAGNOSIS — G8929 Other chronic pain: Secondary | ICD-10-CM

## 2022-12-17 DIAGNOSIS — Z7185 Encounter for immunization safety counseling: Secondary | ICD-10-CM | POA: Diagnosis not present

## 2022-12-17 DIAGNOSIS — M545 Low back pain, unspecified: Secondary | ICD-10-CM

## 2022-12-17 DIAGNOSIS — Z23 Encounter for immunization: Secondary | ICD-10-CM | POA: Diagnosis not present

## 2022-12-17 DIAGNOSIS — Z136 Encounter for screening for cardiovascular disorders: Secondary | ICD-10-CM

## 2022-12-17 DIAGNOSIS — Z1389 Encounter for screening for other disorder: Secondary | ICD-10-CM

## 2022-12-17 DIAGNOSIS — Z0000 Encounter for general adult medical examination without abnormal findings: Secondary | ICD-10-CM | POA: Diagnosis not present

## 2022-12-17 LAB — POCT URINALYSIS DIP (PROADVANTAGE DEVICE)
Bilirubin, UA: NEGATIVE
Blood, UA: NEGATIVE
Glucose, UA: NEGATIVE mg/dL
Ketones, POC UA: NEGATIVE mg/dL
Leukocytes, UA: NEGATIVE
Nitrite, UA: NEGATIVE
Protein Ur, POC: NEGATIVE mg/dL
Specific Gravity, Urine: 1.02
Urobilinogen, Ur: NEGATIVE
pH, UA: 6 (ref 5.0–8.0)

## 2022-12-17 LAB — LIPID PANEL
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
## 2022-12-18 LAB — LIPID PANEL
Cholesterol, Total: 168 mg/dL (ref 100–199)
HDL: 19 mg/dL — ABNORMAL LOW (ref >39)
LDL CALC COMMENT:: 8.8 ratio — ABNORMAL HIGH (ref 0.0–5.0)
LDL Chol Calc (NIH): 79 mg/dL (ref 0–99)
Triglycerides: 431 mg/dL — ABNORMAL HIGH (ref 0–149)
VLDL Cholesterol Cal: 70 mg/dL — ABNORMAL HIGH (ref 5–40)

## 2022-12-18 LAB — COMPREHENSIVE METABOLIC PANEL
ALT: 27 IU/L (ref 0–44)
AST: 17 IU/L (ref 0–40)
Albumin: 4.6 g/dL (ref 4.1–5.1)
Alkaline Phosphatase: 80 IU/L (ref 44–121)
BUN/Creatinine Ratio: 21 — ABNORMAL HIGH (ref 9–20)
BUN: 18 mg/dL (ref 6–24)
Bilirubin Total: 0.4 mg/dL (ref 0.0–1.2)
CO2: 21 mmol/L (ref 20–29)
Calcium: 8.8 mg/dL (ref 8.7–10.2)
Chloride: 105 mmol/L (ref 96–106)
Creatinine, Ser: 0.84 mg/dL (ref 0.76–1.27)
Globulin, Total: 2.4 g/dL (ref 1.5–4.5)
Glucose: 91 mg/dL (ref 70–99)
Potassium: 4.4 mmol/L (ref 3.5–5.2)
Sodium: 140 mmol/L (ref 134–144)
Total Protein: 7 g/dL (ref 6.0–8.5)
eGFR: 112 mL/min/{1.73_m2} (ref >59)

## 2022-12-18 LAB — CBC
Hematocrit: 43.6 % (ref 37.5–51.0)
Hemoglobin: 14.5 g/dL (ref 13.0–17.7)
MCH: 29.2 pg (ref 26.6–33.0)
MCHC: 33.3 g/dL (ref 31.5–35.7)
MCV: 88 fL (ref 79–97)
Platelets: 275 10*3/uL (ref 150–450)
RBC: 4.97 x10E6/uL (ref 4.14–5.80)
RDW: 12.6 % (ref 11.6–15.4)
WBC: 5.2 10*3/uL (ref 3.4–10.8)

## 2022-12-18 LAB — HEPATITIS C ANTIBODY

## 2022-12-18 LAB — HIV ANTIBODY (ROUTINE TESTING W REFLEX)

## 2022-12-18 NOTE — Progress Notes (Signed)
Results sent through MyChart

## 2022-12-22 ENCOUNTER — Other Ambulatory Visit: Payer: Self-pay | Admitting: Medical

## 2022-12-22 MED ORDER — FENOFIBRATE 145 MG PO TABS: 145.0000 mg | ORAL_TABLET | Freq: Every day | ORAL | 0 refills | Status: AC

## 2023-12-23 ENCOUNTER — Encounter: Payer: Managed Care, Other (non HMO) | Admitting: Medical

## 2023-12-25 ENCOUNTER — Encounter: Payer: Self-pay | Admitting: Medical

## 2024-05-13 ENCOUNTER — Encounter: Payer: Self-pay | Admitting: Medical

## 2024-05-20 ENCOUNTER — Encounter: Payer: Self-pay | Admitting: Medical
